# Patient Record
Sex: Female | Born: 1945 | Race: White | Hispanic: No | Smoking: Former smoker
Health system: Southern US, Community
[De-identification: ages and names within clinical notes are randomized; demographics above are authoritative.]

## PROBLEM LIST (undated history)

## (undated) DIAGNOSIS — R87619 Unspecified abnormal cytological findings in specimens from cervix uteri: Secondary | ICD-10-CM

## (undated) DIAGNOSIS — I341 Nonrheumatic mitral (valve) prolapse: Secondary | ICD-10-CM

## (undated) DIAGNOSIS — L4050 Arthropathic psoriasis, unspecified: Secondary | ICD-10-CM

## (undated) DIAGNOSIS — H209 Unspecified iridocyclitis: Secondary | ICD-10-CM

## (undated) DIAGNOSIS — E7800 Pure hypercholesterolemia, unspecified: Secondary | ICD-10-CM

## (undated) DIAGNOSIS — Z862 Personal history of diseases of the blood and blood-forming organs and certain disorders involving the immune mechanism: Secondary | ICD-10-CM

## (undated) DIAGNOSIS — N812 Incomplete uterovaginal prolapse: Secondary | ICD-10-CM

## (undated) DIAGNOSIS — K529 Noninfective gastroenteritis and colitis, unspecified: Secondary | ICD-10-CM

## (undated) DIAGNOSIS — M1990 Unspecified osteoarthritis, unspecified site: Secondary | ICD-10-CM

## (undated) DIAGNOSIS — R32 Unspecified urinary incontinence: Secondary | ICD-10-CM

## (undated) DIAGNOSIS — F419 Anxiety disorder, unspecified: Secondary | ICD-10-CM

## (undated) DIAGNOSIS — H43813 Vitreous degeneration, bilateral: Secondary | ICD-10-CM

## (undated) HISTORY — DX: Pure hypercholesterolemia, unspecified: E78.00

## (undated) HISTORY — DX: Unspecified iridocyclitis: H20.9

## (undated) HISTORY — DX: Incomplete uterovaginal prolapse: N81.2

## (undated) HISTORY — DX: Personal history of diseases of the blood and blood-forming organs and certain disorders involving the immune mechanism: Z86.2

## (undated) HISTORY — DX: Nonrheumatic mitral (valve) prolapse: I34.1

## (undated) HISTORY — DX: Unspecified urinary incontinence: R32

## (undated) HISTORY — DX: Vitreous degeneration, bilateral: H43.813

## (undated) HISTORY — DX: Unspecified abnormal cytological findings in specimens from cervix uteri: R87.619

## (undated) HISTORY — PX: CRYOTHERAPY: SHX1416

## (undated) HISTORY — DX: Noninfective gastroenteritis and colitis, unspecified: K52.9

## (undated) HISTORY — DX: Anxiety disorder, unspecified: F41.9

## (undated) HISTORY — DX: Arthropathic psoriasis, unspecified: L40.50

---
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
## 1977-05-11 HISTORY — PX: BREAST ENHANCEMENT SURGERY: SHX7

## 1998-08-13 ENCOUNTER — Other Ambulatory Visit: Admission: RE | Admit: 1998-08-13 | Discharge: 1998-08-13 | Payer: Self-pay | Admitting: Obstetrics and Gynecology

## 1999-03-06 ENCOUNTER — Other Ambulatory Visit: Admission: RE | Admit: 1999-03-06 | Discharge: 1999-03-06 | Payer: Self-pay | Admitting: Gynecology

## 1999-11-13 ENCOUNTER — Other Ambulatory Visit: Admission: RE | Admit: 1999-11-13 | Discharge: 1999-11-13 | Payer: Self-pay | Admitting: Gynecology

## 2000-11-16 ENCOUNTER — Other Ambulatory Visit: Admission: RE | Admit: 2000-11-16 | Discharge: 2000-11-16 | Payer: Self-pay | Admitting: Gynecology

## 2003-02-09 ENCOUNTER — Other Ambulatory Visit: Admission: RE | Admit: 2003-02-09 | Discharge: 2003-02-09 | Payer: Self-pay | Admitting: Gynecology

## 2004-03-06 ENCOUNTER — Other Ambulatory Visit: Admission: RE | Admit: 2004-03-06 | Discharge: 2004-03-06 | Payer: Self-pay | Admitting: Obstetrics and Gynecology

## 2005-01-27 ENCOUNTER — Ambulatory Visit: Payer: Self-pay | Admitting: Internal Medicine

## 2005-02-10 ENCOUNTER — Ambulatory Visit: Payer: Self-pay | Admitting: Internal Medicine

## 2005-02-10 ENCOUNTER — Encounter (INDEPENDENT_AMBULATORY_CARE_PROVIDER_SITE_OTHER): Payer: Self-pay | Admitting: Specialist

## 2005-03-09 ENCOUNTER — Other Ambulatory Visit: Admission: RE | Admit: 2005-03-09 | Discharge: 2005-03-09 | Payer: Self-pay | Admitting: Obstetrics and Gynecology

## 2006-02-19 ENCOUNTER — Other Ambulatory Visit: Admission: RE | Admit: 2006-02-19 | Discharge: 2006-02-19 | Payer: Self-pay | Admitting: Obstetrics and Gynecology

## 2007-06-08 ENCOUNTER — Other Ambulatory Visit: Admission: RE | Admit: 2007-06-08 | Discharge: 2007-06-08 | Payer: Self-pay | Admitting: Obstetrics and Gynecology

## 2008-03-23 ENCOUNTER — Ambulatory Visit: Payer: Self-pay | Admitting: *Deleted

## 2008-05-11 HISTORY — PX: BREAST IMPLANT REMOVAL: SUR1101

## 2008-09-05 ENCOUNTER — Other Ambulatory Visit: Admission: RE | Admit: 2008-09-05 | Discharge: 2008-09-05 | Payer: Self-pay | Admitting: Obstetrics and Gynecology

## 2011-12-30 ENCOUNTER — Encounter: Payer: Self-pay | Admitting: Internal Medicine

## 2012-09-02 ENCOUNTER — Encounter: Payer: Self-pay | Admitting: Internal Medicine

## 2013-04-05 ENCOUNTER — Other Ambulatory Visit: Payer: Self-pay | Admitting: Dermatology

## 2013-05-30 ENCOUNTER — Encounter: Payer: Self-pay | Admitting: Obstetrics and Gynecology

## 2013-05-31 ENCOUNTER — Encounter: Payer: Self-pay | Admitting: Obstetrics and Gynecology

## 2013-05-31 ENCOUNTER — Ambulatory Visit: Payer: Self-pay | Admitting: Obstetrics and Gynecology

## 2013-05-31 ENCOUNTER — Ambulatory Visit (INDEPENDENT_AMBULATORY_CARE_PROVIDER_SITE_OTHER): Payer: Medicare Other | Admitting: Obstetrics and Gynecology

## 2013-05-31 ENCOUNTER — Telehealth: Payer: Self-pay | Admitting: Obstetrics and Gynecology

## 2013-05-31 VITALS — BP 150/80 | HR 61 | Resp 14 | Ht 64.5 in | Wt 156.0 lb

## 2013-05-31 DIAGNOSIS — Z01419 Encounter for gynecological examination (general) (routine) without abnormal findings: Secondary | ICD-10-CM

## 2013-05-31 NOTE — Patient Instructions (Signed)

## 2013-05-31 NOTE — Progress Notes (Signed)
68 y.o. [id number] Divorced Caucasian Fe here for annual exam.   Has mild hyperlipidemia and mildly elevated blood pressure.  Patient developed psoriasis recently.  Wants help. Interested in complimentary medicine. Also will be seeing [facility].   Interested in a pessary for prolapse.  Having urinary incontinence. Leaks with cough, laugh, or sneeze.   Wants a pap today.   No LMP recorded. Patient is postmenopausal.          Sexually active: no  The current method of family planning is post menopausal status.    Exercising: yes  treadmill Smoker:  no  Health Maintenance: Pap:  2011 neg Pap history:  History of abnormal paps in remote past.  Had a dilation and curettage and conization in late 20s or early 58s.  Last one was here in the office. Had colposcopy with Dr. Joan Flores.  No treatment.   MMG:  2014 Colonoscopy:  2012 polyps  BMD:   2014 TDaP:  2014 Labs: none Self breast exam: not done   reports that she quit smoking about 2 years ago. She has never used smokeless tobacco. She reports that she drinks about 1.0 ounces of alcohol per week. She reports that she does not use illicit drugs.  Past Medical History  Diagnosis Date  . MVP (mitral valve prolapse)   . Anxiety   . Colitis   . Vitreous detachment, bilateral   . Hypercholesteremia   . History of anemia   . Abnormal Pap smear of cervix     Past Surgical History  Procedure Laterality Date  . Tonsillectomy and adenoidectomy  1954  . Breast enhancement surgery  1979  . Breast implant removal  2010  . Cryotherapy      Current Outpatient Prescriptions  Medication Sig Dispense Refill  . aspirin 81 MG tablet Take 81 mg by mouth as needed for pain.      Marland Kitchen atorvastatin (LIPITOR) 40 MG tablet Take 40 mg by mouth daily.      . Cholecalciferol (VITAMIN D PO) Take by mouth daily.      . sertraline (ZOLOFT) 20 MG/ML concentrated solution Take by mouth daily.      Marland Kitchen UNABLE TO FIND Med Name: Pro Ton Pump      .  VITAMIN K PO Take by mouth daily.      . Multiple Vitamins-Minerals (MULTIVITAMIN PO) Take by mouth.      . Omega-3 Fatty Acids (OMEGA-3 FISH OIL PO) Take by mouth.       No current facility-administered medications for this visit.    Family History  Problem Relation Age of Onset  . Breast cancer Mother   . Osteoporosis Mother   . Hypertension Father   . Aneurysm Father   . Pancreatic cancer Maternal Grandmother   . Diabetes Paternal Grandfather     Type 2    ROS:  Pertinent items are noted in HPI.  Otherwise, a comprehensive ROS was negative.  Exam:   BP 150/80  Pulse 61  Resp 14  Ht 5' 4.5" (1.638 m)  Wt 156 lb (70.761 kg)  BMI 26.37 kg/m2 Height: 5' 4.5" (163.8 cm)  Ht Readings from Last 3 Encounters:  05/31/13 5' 4.5" (1.638 m)    General appearance: alert, cooperative and appears stated age Head: Normocephalic, without obvious abnormality, atraumatic Neck: no adenopathy, supple, symmetrical, trachea midline and thyroid normal to inspection and palpation Lungs: clear to auscultation bilaterally Breasts: normal appearance, no masses or tenderness, No nipple retraction or dimpling, No  nipple discharge or bleeding, No axillary or supraclavicular adenopathy,  bilateral inferior breast scars consistent with prior surgery. Heart: regular rate and rhythm Abdomen: soft, non-tender; no masses,  no organomegaly Extremities: extremities normal, atraumatic, no cyanosis or edema Skin: Skin color, texture, turgor normal. No rashes or lesions Lymph nodes: Cervical, supraclavicular, and axillary nodes normal. No abnormal inguinal nodes palpated Neurologic: Grossly normal   Pelvic: External genitalia:  Scattered erythematous plaques, nontender.               Urethra:  normal appearing urethra with no masses, tenderness or lesions              Bartholin's and Skene's: normal                 Vagina: normal appearing vagina with normal color and discharge, no lesions.  First degree  cystocele without strain.               Cervix: no lesions              Pap taken: yes and HR HPV testing.  Bimanual Exam:  Uterus:  normal size, contour, position, consistency, mobility, non-tender              Adnexa: normal adnexa               Rectovaginal: Confirms               Anus:  normal sphincter tone, no lesions  A:  Well Woman with normal exam Pelvic organ prolapse. Vulvar psoriasis.   P:   Pap smear as per guidelines   M[facility] at [facility].  Patient will call to schedule.  Return for pessary fitting. I discussed [email or web address] as a source of medical information.  I suggested Dr. Adriana Simas as a possible physician to explore complimentary medicine for her psoriasis.   return annually or prn  An After Visit Summary was printed and given to the patient.

## 2013-05-31 NOTE — Telephone Encounter (Signed)
Pt says she needs to schedule an appt with Dr Quincy Simmonds for a fitting?

## 2013-06-01 NOTE — Telephone Encounter (Signed)
Spoke with pt to schedule pessary fitting as discussed at yesterday's visit with BS. Made appt 06-14-13 at 3 pm.

## 2013-06-05 LAB — IPS PAP TEST WITH HPV

## 2013-06-11 DIAGNOSIS — N812 Incomplete uterovaginal prolapse: Secondary | ICD-10-CM

## 2013-06-11 HISTORY — DX: Incomplete uterovaginal prolapse: N81.2

## 2013-06-14 ENCOUNTER — Encounter: Payer: Self-pay | Admitting: Obstetrics and Gynecology

## 2013-06-14 ENCOUNTER — Ambulatory Visit (INDEPENDENT_AMBULATORY_CARE_PROVIDER_SITE_OTHER): Payer: Medicare Other | Admitting: Obstetrics and Gynecology

## 2013-06-14 VITALS — BP 141/78 | HR 63 | Resp 14 | Ht 64.75 in | Wt 150.0 lb

## 2013-06-14 DIAGNOSIS — N812 Incomplete uterovaginal prolapse: Secondary | ICD-10-CM

## 2013-06-14 NOTE — Patient Instructions (Signed)
We will contact you when the pessary arrives.

## 2013-06-15 ENCOUNTER — Encounter: Payer: Self-pay | Admitting: Obstetrics and Gynecology

## 2013-06-15 NOTE — Progress Notes (Signed)
Patient ID: Janice Welch, female   DOB: 1946/04/12, 68 y.o.   MRN: [id number]  Subjective  Patient presents today for a pessary fitting. Notes protrusion outside of the vagina when she is exercising. Also having urinary incontinence when she coughs, sneezes, or laughs.  Seeing a dermatologist for her psoriasis of the vulva.  Objective  Pelvic exam  Patchy erythma of the vulva. Normal urethra. Cervix no lesions. Third degree cystocele. First degree uterine prolapse. No significant rectocele. Uterus small and nontender. No adnexal masses or tenderness.  Pessaries tried - #2 incontinence dish, #3 incontinence dish, and #4 incontinence dish.  Patient feels most comfortable with #3 incontinence dish.    Assessment  Incomplete uterovaginal prolapse. Genuine stress incontinence.  Plan  Will order an incontinence dish #3. Patient will be called when it arrives. Instructions in use reviewed with patient.  May consider vaginal estrogen cream use with the pessary.

## 2013-06-19 ENCOUNTER — Encounter: Payer: Self-pay | Admitting: Obstetrics and Gynecology

## 2013-06-22 ENCOUNTER — Telehealth: Payer: Self-pay | Admitting: *Deleted

## 2013-06-22 NOTE — Telephone Encounter (Signed)
Called pt to notify pessary is ready. She made appt for 07/12/2013 @10a .

## 2013-07-12 ENCOUNTER — Ambulatory Visit (INDEPENDENT_AMBULATORY_CARE_PROVIDER_SITE_OTHER): Payer: Medicare Other | Admitting: Obstetrics and Gynecology

## 2013-07-12 ENCOUNTER — Encounter: Payer: Self-pay | Admitting: Obstetrics and Gynecology

## 2013-07-12 ENCOUNTER — Telehealth: Payer: Self-pay | Admitting: Obstetrics and Gynecology

## 2013-07-12 VITALS — BP 102/69 | HR 66 | Resp 14 | Wt 148.0 lb

## 2013-07-12 DIAGNOSIS — N393 Stress incontinence (female) (male): Secondary | ICD-10-CM

## 2013-07-12 DIAGNOSIS — N812 Incomplete uterovaginal prolapse: Secondary | ICD-10-CM

## 2013-07-12 MED ORDER — ESTRADIOL 0.1 MG/GM VA CREA: TOPICAL_CREAM | VAGINAL | Status: DC

## 2013-07-12 NOTE — Telephone Encounter (Signed)
Patient needs a 10 day recheck with Dr.Silva. No appointments available.

## 2013-07-12 NOTE — Patient Instructions (Signed)
Estradiol vaginal cream What is this medicine? ESTRADIOL (es tra DYE ole) contains the female hormone estrogen. It is used for symptoms of menopause, like vaginal dryness and irritation. This medicine may be used for other purposes; ask your health care provider or pharmacist if you have questions. COMMON BRAND NAME(S): Estrace What should I tell my health care provider before I take this medicine? They need to know if you have any of these conditions: -abnormal vaginal bleeding -blood vessel disease or blood clots -breast, cervical, endometrial, ovarian, liver, or uterine cancer -dementia -diabetes -gallbladder disease -heart disease or recent heart attack -high blood pressure -high cholesterol -high levels of calcium in the blood -hysterectomy -kidney disease -liver disease -migraine headaches -protein C deficiency -protein S deficiency -stroke -systemic lupus erythematosus (SLE) -tobacco smoker -an unusual or allergic reaction to estrogens, other hormones, soy, other medicines, foods, dyes, or preservatives -pregnant or trying to get pregnant -breast-feeding How should I use this medicine? This medicine is for use in the vagina only. Do not take by mouth. Follow the directions on the prescription label. Read package directions carefully before using. Use the special applicator supplied with the cream. Wash hands before and after use. Fill the applicator with the prescribed amount of cream. Lie on your back, part and bend your knees. Insert the applicator into the vagina and push the plunger to expel the cream into the vagina. Wash the applicator with warm soapy water and rinse well. Use exactly as directed for the complete length of time prescribed. Do not stop using except on the advice of your doctor or health care professional. A patient package insert for the product will be given with each prescription and refill. Read this sheet carefully each time. The sheet may change  frequently. Talk to your pediatrician regarding the use of this medicine in children. This medicine is not approved for use in children. Overdosage: If you think you have taken too much of this medicine contact a poison control center or emergency room at once. NOTE: This medicine is only for you. Do not share this medicine with others. What if I miss a dose? If you miss a dose, use it as soon as you can. If it is almost time for your next dose, use only that dose. Do not use double or extra doses. What may interact with this medicine? Do not take this medicine with any of the following medications: -aromatase inhibitors like aminoglutethimide, anastrozole, exemestane, letrozole, testolactone This medicine may also interact with the following medications: -barbiturates used for inducing sleep or treating seizures -carbamazepine -grapefruit juice -medicines for fungal infections like ketoconazole and itraconazole -raloxifene -rifabutin -rifampin -rifapentine -ritonavir -some antibiotics used to treat infections -St. John's Wort -tamoxifen -warfarin This list may not describe all possible interactions. Give your health care provider a list of all the medicines, herbs, non-prescription drugs, or dietary supplements you use. Also tell them if you smoke, drink alcohol, or use illegal drugs. Some items may interact with your medicine. What should I watch for while using this medicine? Visit your health care professional for regular checks on your progress. You will need a regular breast and pelvic exam. You should also discuss the need for regular mammograms with your health care professional, and follow his or her guidelines. This medicine can make your body retain fluid, making your fingers, hands, or ankles swell. Your blood pressure can go up. Contact your doctor or health care professional if you feel you are retaining fluid. If you have  any reason to think you are pregnant, stop taking  this medicine at once and contact your doctor or health care professional. Tobacco smoking increases the risk of getting a blood clot or having a stroke, especially if you are more than 67 years old. You are strongly advised not to smoke. If you wear contact lenses and notice visual changes, or if the lenses begin to feel uncomfortable, consult your eye care specialist. If you are going to have elective surgery, you may need to stop taking this medicine beforehand. Consult your health care professional for advice prior to scheduling the surgery. What side effects may I notice from receiving this medicine? Side effects that you should report to your doctor or health care professional as soon as possible: -allergic reactions like skin rash, itching or hives, swelling of the face, lips, or tongue -breast tissue changes or discharge -changes in vision -chest pain -confusion, trouble speaking or understanding -dark urine -general ill feeling or flu-like symptoms -light-colored stools -nausea, vomiting -pain, swelling, warmth in the leg -right upper belly pain -severe headaches -shortness of breath -sudden numbness or weakness of the face, arm or leg -trouble walking, dizziness, loss of balance or coordination -unusual vaginal bleeding -yellowing of the eyes or skin Side effects that usually do not require medical attention (report to your doctor or health care professional if they continue or are bothersome): -hair loss -increased hunger or thirst -increased urination -symptoms of vaginal infection like itching, irritation or unusual discharge -unusually weak or tired This list may not describe all possible side effects. Call your doctor for medical advice about side effects. You may report side effects to FDA at 1-800-FDA-1088. Where should I keep my medicine? Keep out of the reach of children. Store at room temperature between 15 and 30 degrees C (59 and 86 degrees F). Protect from  temperatures above 40 degrees C (104 degrees C). Do not freeze. Throw away any unused medicine after the expiration date. NOTE: This sheet is a summary. It may not cover all possible information. If you have questions about this medicine, talk to your doctor, pharmacist, or health care provider.  2014, Elsevier/Gold Standard. (2010-07-30 09:18:12)  

## 2013-07-12 NOTE — Progress Notes (Addendum)
Patient ID: Janice Welch, female   DOB: [birth year], 68 y.o.   MRN: [id number]  Subjective  Patient presents today for a pessary supply. Notes protrusion outside of the vagina when she is exercising.  Also having urinary incontinence when she coughs, sneezes, or laughs.  Seeing a dermatologist for her psoriasis of the vulva  Incontinence disk #3 fitted at the last visit.   Mammogram 06/02/13 showed benign calcifications of the left breast.   Objective  Small cystocele.    Assessment  Incomplete uterovaginal prolapse. Genuine stress incontinence. Fitted with an incontinence dish #3 and given to patient - Integra Pessary dish size e with support   Plan  Patient able to void with pessary in.  Can place and remove without difficulty.  Patient instructed in used.  Estrace 1/2 gram per vagina at hs twice a week.  Benefits and risks discussed of thromboembolic events (DVT, PE, MI, stroke) and breast cancer.  Follow up in 10 days for a recheck.   15 minutes face to face time of which over 50% was spent in counseling.   After visit summary to patient.

## 2013-07-13 NOTE — Telephone Encounter (Signed)
Dr Quincy Simmonds, [facility].

## 2013-07-13 NOTE — Telephone Encounter (Signed)
Patient now scheduled for 07-21-13.  Routing to provider for final review. Patient agreeable to disposition. Will close encounter

## 2013-07-19 ENCOUNTER — Encounter: Payer: Self-pay | Admitting: Obstetrics and Gynecology

## 2013-07-21 ENCOUNTER — Encounter: Payer: Self-pay | Admitting: Obstetrics and Gynecology

## 2013-07-21 ENCOUNTER — Ambulatory Visit (INDEPENDENT_AMBULATORY_CARE_PROVIDER_SITE_OTHER): Payer: Medicare Other | Admitting: Obstetrics and Gynecology

## 2013-07-21 VITALS — BP 110/70 | HR 60 | Ht 64.75 in | Wt 147.5 lb

## 2013-07-21 DIAGNOSIS — N812 Incomplete uterovaginal prolapse: Secondary | ICD-10-CM

## 2013-07-21 NOTE — Progress Notes (Signed)
Patient ID: Janice Welch, female   DOB: 05-May-1946, 68 y.o.   MRN: [id number]   Subjective   Patient is here for a pessary check.  Received an incontinence dish #4 on 07/12/13 along with a prescription for Estrace vaginal cream.  Comfortable for use.  Able to place and remove.   Voiding well.  Bowel movements OK.  Not using the vaginal cream.  Was too expensive so did not fill the Rx.  Just using KY jelly.   Cleansing with dish soap.  Objective  Pessary removed, cleansed, and replaced.  Yellow coloration to the pessary.  Pelvic exam - normal external genitalia and urethra. Cervix and vagina without lesions or erythema. No discharge noted.   Assessment  Incomplete uterovaginal prolapse. Doing well with incontinence dish.  Plan  Continue pessary use. Patient will research cost of Estring, Premarin vaginal cream and Vagifem prices to see if one of these will be more affordable. Use regular antibacterial bath soap instead of dish soap to clean. Return prn. Has appointment for annual exam in January 2016.   15 minutes face to face tie of which over 50% was spent in counseling.

## 2013-07-21 NOTE — Patient Instructions (Signed)
Consider Estring, Vagifem tablets, Estrace vaginal cream, and premarin cream for vaginal estrogen options. If you find one that is attractive, please let me know and I can prescribe it for you.

## 2013-09-08 ENCOUNTER — Encounter: Payer: Self-pay | Admitting: Obstetrics and Gynecology

## 2014-03-01 ENCOUNTER — Telehealth: Payer: Self-pay | Admitting: Obstetrics and Gynecology

## 2014-03-01 NOTE — Telephone Encounter (Signed)
Dr cx/rs appt/ lmtcb to rs °

## 2014-03-12 ENCOUNTER — Encounter: Payer: Self-pay | Admitting: Obstetrics and Gynecology

## 2014-06-01 ENCOUNTER — Ambulatory Visit: Payer: Medicare Other | Admitting: Obstetrics and Gynecology

## 2014-06-07 ENCOUNTER — Encounter: Payer: Self-pay | Admitting: Certified Nurse Midwife

## 2014-06-07 ENCOUNTER — Ambulatory Visit (INDEPENDENT_AMBULATORY_CARE_PROVIDER_SITE_OTHER): Payer: Medicare Other | Admitting: Certified Nurse Midwife

## 2014-06-07 ENCOUNTER — Ambulatory Visit: Payer: Medicare Other | Admitting: Obstetrics and Gynecology

## 2014-06-07 VITALS — BP 100/68 | HR 68 | Resp 16 | Ht 64.5 in | Wt 138.0 lb

## 2014-06-07 DIAGNOSIS — Z4689 Encounter for fitting and adjustment of other specified devices: Secondary | ICD-10-CM

## 2014-06-07 DIAGNOSIS — Z01419 Encounter for gynecological examination (general) (routine) without abnormal findings: Secondary | ICD-10-CM

## 2014-06-07 NOTE — Progress Notes (Signed)
69 y.o. [id number] Divorced  Caucasian Fe here for annual exam.  Menopausal dryness using [facility] cream with good results. Reports no vaginal bleeding and no problems with Pessary use for support. Denies urinary symptoms or bowel symptoms with pessary use. Eats mainly vegetarian diet with some fish and chicken.  Sees PCP for cholesterol management/ aex/labs. No health issues today.  Patient's last menstrual period was 05/11/1992.          Sexually active: No.  The current method of family planning is post menopausal status.    Exercising: Yes.    walking Smoker:  no  Health Maintenance: Pap:  2011 neg MMG:  06-02-13 neg Colonoscopy:  2012 polyp q 43yrs BMD:   2013 TDaP:  2014 Labs: none Self breast exam: not done   reports that she quit smoking about 3 years ago. She has never used smokeless tobacco. She reports that she drinks alcohol. She reports that she does not use illicit drugs.  Past Medical History  Diagnosis Date  . MVP (mitral valve prolapse)   . Anxiety   . Colitis   . Vitreous detachment, bilateral   . Hypercholesteremia   . History of anemia   . Abnormal Pap smear of cervix   . Urinary incontinence   . First degree uterine prolapse 06/2013    Past Surgical History  Procedure Laterality Date  . Tonsillectomy and adenoidectomy  1954  . Breast enhancement surgery  1979  . Breast implant removal  2010  . Cryotherapy      Current Outpatient Prescriptions  Medication Sig Dispense Refill  . atorvastatin (LIPITOR) 40 MG tablet Take 20 mg by mouth daily.     . Biotin 7500 MCG TABS Take by mouth daily.    . Cholecalciferol (VITAMIN D PO) Take 5,000 Int'l Units by mouth daily.     . Cyanocobalamin (VITAMIN B 12 PO) Take 2,000 mg by mouth.    . FOLIC ACID PO Take by mouth daily.    . Melatonin 5 MG CAPS Take 5 mg by mouth daily.    . milk thistle 175 MG tablet Take 175 mg by mouth daily.    . Multiple Vitamins-Minerals (MULTIVITAMIN PO) Take by mouth.    .  Multiple Vitamins-Minerals (ZINC PO) Take by mouth daily.    . NON FORMULARY Tumeric  1000mg  daily    . Omega-3 Fatty Acids (OMEGA-3 FISH OIL PO) Take by mouth.    . SELENIUM PO Take by mouth daily.    . sertraline (ZOLOFT) 50 MG tablet Take 50 mg by mouth daily.    Marland Kitchen UNABLE TO FIND 500 mg daily. Choline & inositol     No current facility-administered medications for this visit.    Family History  Problem Relation Age of Onset  . Breast cancer Mother   . Osteoporosis Mother   . Hypertension Father   . Aneurysm Father   . Pancreatic cancer Maternal Grandmother   . Diabetes Paternal Grandfather     Type 2    ROS:  Pertinent items are noted in HPI.  Otherwise, a comprehensive ROS was negative.  Exam:   BP 100/68 mmHg  Pulse 68  Resp 16  Ht 5' 4.5" (1.638 m)  Wt 138 lb (62.596 kg)  BMI 23.33 kg/m2  LMP 05/11/1992 Height: 5' 4.5" (163.8 cm) Ht Readings from Last 3 Encounters:  06/07/14 5' 4.5" (1.638 m)  07/21/13 5' 4.75" (1.645 m)  06/14/13 5' 4.75" (1.645 m)    General appearance:  alert, cooperative and appears stated age Head: Normocephalic, without obvious abnormality, atraumatic Neck: no adenopathy, supple, symmetrical, trachea midline and thyroid normal to inspection and palpation Lungs: clear to auscultation bilaterally Breasts: normal appearance, no masses or tenderness, No nipple retraction or dimpling, No nipple discharge or bleeding, No axillary or supraclavicular adenopathy Heart: regular rate and rhythm Abdomen: soft, non-tender; no masses,  no organomegaly Extremities: extremities normal, atraumatic, no cyanosis or edema Skin: Skin color, texture, turgor normal. No rashes or lesions Lymph nodes: Cervical, supraclavicular, and axillary nodes normal. No abnormal inguinal nodes palpated Neurologic: Grossly normal   Pelvic: External genitalia:  no lesions              Urethra:  normal appearing urethra with no masses, tenderness or lesions               Bartholin's and Skene's: normal                 Vagina: normal appearing vagina with normal color and discharge, no lesions or excoriations or ulcerations after removing pessary noted in proper position for support              Cervix: normal with cryo appearance, non tender no ulcerations  noted              Pap taken: No. Bimanual Exam:  Uterus:  normal size, contour, position, consistency, mobility, non-tender              Adnexa: normal adnexa and no mass, fullness, tenderness               Rectovaginal: Confirms               Anus:  normal sphincter tone, no lesions Pessary cleaned and reinserted with proper position noted and patient agreed feels normal. Ambulated and sat with change.Patient removes and inserts herself with Rolm Baptise use.    A:  Well Woman with normal exam  Menopausal no HRT  Incomplete uterovaginal prolapse with  # 3 incontinence dish with support, no contraindications to continued use  Cholesterol management with stable medication with PCP  History of abnormal pap with Cryo greater than 20 years ago.  P:   Reviewed health and wellness pertinent to exam  Aware of need for evaluation if vaginal bleeding  Aware of warning signs with pessary use  Continue follow up as indicated with PCP  Pap smear not taken today   counseled on breast self exam, mammography screening, adequate intake of calcium and vitamin D, diet and exercise, Kegel's exercises  return annually or prn  An After Visit Summary was printed and given to the patient.

## 2014-06-07 NOTE — Patient Instructions (Signed)

## 2014-06-07 NOTE — Progress Notes (Signed)
Reviewed personally.  M. Suzanne Rickey Sadowski, MD.  

## 2014-07-11 ENCOUNTER — Encounter: Payer: Self-pay | Admitting: Certified Nurse Midwife

## 2014-07-11 NOTE — Telephone Encounter (Signed)
Spoke with Leeroy Bock, front office supervisor, and okay to send records to home address as patient requesting in writing via mychart that they be sent to home address.   Mammogram, name and address confirmed with office administrator, Thayer Ohm. Mailed via [place] mail.   Routing to provider for final review. Patient agreeable to disposition. Will close encounter

## 2014-11-05 ENCOUNTER — Other Ambulatory Visit: Payer: Self-pay

## 2015-06-13 ENCOUNTER — Encounter: Payer: Self-pay | Admitting: Certified Nurse Midwife

## 2015-06-13 ENCOUNTER — Ambulatory Visit (INDEPENDENT_AMBULATORY_CARE_PROVIDER_SITE_OTHER): Payer: Medicare Other | Admitting: Certified Nurse Midwife

## 2015-06-13 VITALS — BP 110/68 | HR 72 | Resp 16 | Ht 64.25 in | Wt 134.0 lb

## 2015-06-13 DIAGNOSIS — Z124 Encounter for screening for malignant neoplasm of cervix: Secondary | ICD-10-CM | POA: Diagnosis not present

## 2015-06-13 DIAGNOSIS — Z01419 Encounter for gynecological examination (general) (routine) without abnormal findings: Secondary | ICD-10-CM

## 2015-06-13 NOTE — Patient Instructions (Signed)

## 2015-06-13 NOTE — Progress Notes (Signed)
70 y.o. [id number] Divorced  Caucasian Fe here for annual exam.  Menopausal no HRT. Denies vaginal bleeding or vaginal dryness. Pessary is still working well for vaginal support.( did not bring with her today). Occasional stress incontinence no issues. Saw PCP recently for aex/labs/Cholesterol management. Patient takes a supplement smoothie daily and cholesterol is so much better with decrease in medication now. No other health issues today.  Patient's last menstrual period was 05/11/1992.          Sexually active: No.  The current method of family planning is post menopausal status.    Exercising: Yes.    walking Smoker:  no  Health Maintenance: Pap: 05-31-13 neg HPV HR neg, cryo done in the past MMG:  06-21-14 category b density birads 1:neg Colonoscopy:  2012 polyp f/u 45yrs due this year BMD:   2013 TDaP:  2014 Shingles: had done  Pneumonia: 3yrs ago Hep C and HIV: neg per patient Labs: pcp Self breast exam: done occ   reports that she quit smoking about 4 years ago. She has never used smokeless tobacco. She reports that she does not drink alcohol or use illicit drugs.  Past Medical History  Diagnosis Date  . MVP (mitral valve prolapse)   . Anxiety   . Colitis   . Vitreous detachment, bilateral   . Hypercholesteremia   . History of anemia   . Abnormal Pap smear of cervix   . Urinary incontinence   . First degree uterine prolapse 06/2013    Past Surgical History  Procedure Laterality Date  . Tonsillectomy and adenoidectomy  1954  . Breast enhancement surgery  1979  . Breast implant removal  2010  . Cryotherapy      Current Outpatient Prescriptions  Medication Sig Dispense Refill  . atorvastatin (LIPITOR) 10 MG tablet Take 10 mg by mouth daily.    . Biotin 7500 MCG TABS Take by mouth daily.    . Cholecalciferol (VITAMIN D PO) Take 5,000 Int'l Units by mouth daily.     . Cyanocobalamin (VITAMIN B 12 PO) Take 2,000 mg by mouth.    . Flaxseed, Linseed, (FLAXSEED OIL PO) Take  by mouth daily.    Marland Kitchen FOLIC ACID PO Take by mouth daily.    . Melatonin 5 MG CAPS Take 5 mg by mouth daily.    . Multiple Vitamins-Minerals (MULTIVITAMIN PO) Take by mouth.    . Multiple Vitamins-Minerals (ZINC PO) Take by mouth daily.    . NON FORMULARY Tumeric  1000mg  daily    . Omega-3 Fatty Acids (OMEGA-3 FISH OIL PO) Take by mouth.    . SELENIUM PO Take by mouth daily.    . sertraline (ZOLOFT) 50 MG tablet Take 50 mg by mouth daily.    Marland Kitchen UNABLE TO FIND daily. Wheat grass powder     No current facility-administered medications for this visit.    Family History  Problem Relation Age of Onset  . Breast cancer Mother   . Osteoporosis Mother   . Hypertension Father   . Aneurysm Father   . Pancreatic cancer Maternal Grandmother   . Diabetes Paternal Grandfather     Type 2    ROS:  Pertinent items are noted in HPI.  Otherwise, a comprehensive ROS was negative.  Exam:   BP 110/68 mmHg  Pulse 72  Resp 16  Ht 5' 4.25" (1.632 m)  Wt 134 lb (60.782 kg)  BMI 22.82 kg/m2  LMP 05/11/1992 Height: 5' 4.25" (163.2 cm) Ht Readings from Last  3 Encounters:  06/13/15 5' 4.25" (1.632 m)  06/07/14 5' 4.5" (1.638 m)  07/21/13 5' 4.75" (1.645 m)    General appearance: alert, cooperative and appears stated age Head: Normocephalic, without obvious abnormality, atraumatic Neck: no adenopathy, supple, symmetrical, trachea midline and thyroid normal to inspection and palpation Lungs: clear to auscultation bilaterally Breasts: normal appearance, no masses or tenderness, No nipple retraction or dimpling, No nipple discharge or bleeding, No axillary or supraclavicular adenopathy Heart: regular rate and rhythm Abdomen: soft, non-tender; no masses,  no organomegaly Extremities: extremities normal, atraumatic, no cyanosis or edema Skin: Skin color, texture, turgor normal. No rashes or lesions Lymph nodes: Cervical, supraclavicular, and axillary nodes normal. No abnormal inguinal nodes  palpated Neurologic: Grossly normal   Pelvic: External genitalia:  no lesions              Urethra:  normal appearing urethra with no masses, tenderness or lesions              Bartholin's and Skene's: normal                 Vagina: normal appearing vagina with normal color and discharge, no lesions,  No ulcerations noted              Cervix: no cervical motion tenderness, no lesions and scant blood with pap only, no ulcerations noted              Pap taken: Yes.   Bimanual Exam:  Uterus:  normal size, contour, position, consistency, mobility, non-tender and anteverted              Adnexa: normal adnexa and no mass, fullness, tenderness               Rectovaginal: Confirms               Anus:  normal sphincter tone, no lesions  Chaperone present: yes  A:  Well Woman with normal exam  Menopausal no HRT  Complete uterine prolapse with Pessary use, working well  Cholesterol management with PCP  Colonoscopy due this year, patient plans to schedule this summer,IFOB given by PCP    P:   Reviewed health and wellness pertinent to exam  Aware of need to advise if vaginal bleeding or odor with pessary use. Discussed importance of moisturizer use with pessary to avoid ulcerations. Patient will start with coconut oil 2-3 times weekly.  Continue follow up with PCP as needed.  Pap smear as above taken with HPV reflex   counseled on breast self exam, mammography screening, adequate intake of calcium and vitamin D, diet and exercise  return annually or prn  An After Visit Summary was printed and given to the patient.

## 2015-06-13 NOTE — Progress Notes (Signed)
Reviewed personally.  M. Suzanne Jaryan Chicoine, MD.  

## 2015-06-14 LAB — IPS PAP TEST WITH REFLEX TO HPV

## 2015-07-23 ENCOUNTER — Encounter: Payer: Self-pay | Admitting: Certified Nurse Midwife

## 2015-07-23 ENCOUNTER — Ambulatory Visit (INDEPENDENT_AMBULATORY_CARE_PROVIDER_SITE_OTHER): Payer: Medicare Other | Admitting: Certified Nurse Midwife

## 2015-07-23 VITALS — BP 110/70 | HR 70 | Resp 16 | Ht 64.25 in | Wt 139.0 lb

## 2015-07-23 DIAGNOSIS — N63 Unspecified lump in breast: Secondary | ICD-10-CM

## 2015-07-23 DIAGNOSIS — N6310 Unspecified lump in the right breast, unspecified quadrant: Secondary | ICD-10-CM

## 2015-07-23 NOTE — Progress Notes (Signed)
Scheduled patient while in office for bilateral diagnostic mammogram with right breast ultrasound at [facility] on 08/01/2015 at 10:15 am. Placed in mammogram hold.

## 2015-07-23 NOTE — Progress Notes (Signed)
   Subjective:   70 y.o. Divorced Caucasian female presents for evaluation of right breast mass. Onset of the symptoms was in the past week.. Patient sought evaluation because of breast lump.  Contributing factors include mom with breast CA. Denies none.. Patient denies history of trauma, bites, or injuries. Last mammogram was 06/22/15.  Patient was scheduling mammogram and was told she needed to be seen prior if she noted anything the breast.  Review of Systems Pertinent items are noted in HPI.  Objective:   General appearance: alert, cooperative, appears stated age and no distress  Breasts: normal appearance, no masses or tenderness, No nipple retraction or dimpling, No nipple discharge or bleeding, No axillary or supraclavicular adenopathy, positive findings: at 6 o'clock at edge of breast in scar area from  implant removal? mobile oval mass, non tender  Assessment:   ASSESSMENT:Patient is diagnosed with right breast mass   Plan:  Discussed finding with patient of breast mass and need for evaluation with diagnostic mammogram and [place]. Will be scheduled prior to leaving today.

## 2015-07-24 NOTE — Progress Notes (Signed)
Encounter reviewed Janice Hoffmann, MD   

## 2015-08-01 ENCOUNTER — Encounter: Payer: Self-pay | Admitting: Certified Nurse Midwife

## 2015-08-08 ENCOUNTER — Telehealth: Payer: Self-pay

## 2015-08-08 NOTE — Telephone Encounter (Signed)
Non-Urgent Medical Question     From   [name]    To   Janice Welch, CNM    Sent   08/01/2015 12:53 PM       Hi, Miss Janice Welch,   Wanted to let you know that I went for diagnostic mammogram and ultrasound at [facility] today. I was told that the lump is residual silicone (they think) from when I had my implants removed seven years ago. It surprised me because the reason I knew the implant was ruptured is because I had a lump then in exactly the same place as this one....why it should show up now is a mystery to me, but I guess it happens. The doctor said it didn't need biopsy and that I don't have to go for another mammogram for a year. However, he asked me to tell you what he said and to ask you to monitor it.    Thanks! Hope you're having a good day!   Janice Welch CNM patient sent the mychart message above regarding her recent diagnostic mammogram and ultrasound of her right breast with [facility]. She was seen in the office on 07/23/2015 for this. Would you like for me to schedule her for a 6 week recheck around 09/03/2015?

## 2015-08-08 NOTE — Telephone Encounter (Signed)
yes

## 2015-08-08 NOTE — Telephone Encounter (Signed)
Left message to call Kaitlyn at [phone]. 

## 2015-08-08 NOTE — Telephone Encounter (Signed)
Telephone encounter created to discuss mychart message with Deborah Leonard CNM. 

## 2015-08-09 NOTE — Telephone Encounter (Signed)
Patient returned call and she is agreeable to schedule breast check.  Scheduled office visit for 09/05/15 with Melvia Heaps CNM.  Patient to call back with any concerns prior to appointment.  Routing to provider for final review. Patient agreeable to disposition. Will close encounter.

## 2015-09-05 ENCOUNTER — Ambulatory Visit (INDEPENDENT_AMBULATORY_CARE_PROVIDER_SITE_OTHER): Payer: Medicare Other | Admitting: Certified Nurse Midwife

## 2015-09-05 ENCOUNTER — Encounter: Payer: Self-pay | Admitting: Certified Nurse Midwife

## 2015-09-05 VITALS — BP 100/64 | HR 68 | Resp 16 | Ht 64.25 in | Wt 140.0 lb

## 2015-09-05 DIAGNOSIS — N6310 Unspecified lump in the right breast, unspecified quadrant: Secondary | ICD-10-CM

## 2015-09-05 DIAGNOSIS — N63 Unspecified lump in breast: Secondary | ICD-10-CM | POA: Diagnosis not present

## 2015-09-05 NOTE — Patient Instructions (Signed)
Breast Self-Awareness Practicing breast self-awareness may pick up problems early, prevent significant medical complications, and possibly save your life. By practicing breast self-awareness, you can become familiar with how your breasts look and feel and if your breasts are changing. This allows you to notice changes early. It can also offer you some reassurance that your breast health is good. One way to learn what is normal for your breasts and whether your breasts are changing is to do a breast self-exam. If you find a lump or something that was not present in the past, it is best to contact your caregiver right away. Other findings that should be evaluated by your caregiver include nipple discharge, especially if it is bloody; skin changes or reddening; areas where the skin seems to be pulled in (retracted); or new lumps and bumps. Breast pain is seldom associated with cancer (malignancy), but should also be evaluated by a caregiver. HOW TO PERFORM A BREAST SELF-EXAM The best time to examine your breasts is 5-7 days after your menstrual period is over. During menstruation, the breasts are lumpier, and it may be more difficult to pick up changes. If you do not menstruate, have reached menopause, or had your uterus removed (hysterectomy), you should examine your breasts at regular intervals, such as monthly. If you are breastfeeding, examine your breasts after a feeding or after using a breast pump. Breast implants do not decrease the risk for lumps or tumors, so continue to perform breast self-exams as recommended. Talk to your caregiver about how to determine the difference between the implant and breast tissue. Also, talk about the amount of pressure you should use during the exam. Over time, you will become more familiar with the variations of your breasts and more comfortable with the exam. A breast self-exam requires you to remove all your clothes above the waist. 1. Look at your breasts and nipples.  Stand in front of a mirror in a room with good lighting. With your hands on your hips, push your hands firmly downward. Look for a difference in shape, contour, and size from one breast to the other (asymmetry). Asymmetry includes puckers, dips, or bumps. Also, look for skin changes, such as reddened or scaly areas on the breasts. Look for nipple changes, such as discharge, dimpling, repositioning, or redness. 2. Carefully feel your breasts. This is best done either in the shower or tub while using soapy water or when flat on your back. Place the arm (on the side of the breast you are examining) above your head. Use the pads (not the fingertips) of your three middle fingers on your opposite hand to feel your breasts. Start in the underarm area and use  inch (2 cm) overlapping circles to feel your breast. Use 3 different levels of pressure (light, medium, and firm pressure) at each circle before moving to the next circle. The light pressure is needed to feel the tissue closest to the skin. The medium pressure will help to feel breast tissue a little deeper, while the firm pressure is needed to feel the tissue close to the ribs. Continue the overlapping circles, moving downward over the breast until you feel your ribs below your breast. Then, move one finger-width towards the center of the body. Continue to use the  inch (2 cm) overlapping circles to feel your breast as you move slowly up toward the collar bone (clavicle) near the base of the neck. Continue the up and down exam using all 3 pressures until you reach the   middle of the chest. Do this with each breast, carefully feeling for lumps or changes. 3.  Keep a written record with breast changes or normal findings for each breast. By writing this information down, you do not need to depend only on memory for size, tenderness, or location. Write down where you are in your menstrual cycle, if you are still menstruating. Breast tissue can have some lumps or  thick tissue. However, see your caregiver if you find anything that concerns you.  SEEK MEDICAL CARE IF:  You see a change in shape, contour, or size of your breasts or nipples.   You see skin changes, such as reddened or scaly areas on the breasts or nipples.   You have an unusual discharge from your nipples.   You feel a new lump or unusually thick areas.    This information is not intended to replace advice given to you by your health care provider. Make sure you discuss any questions you have with your health care provider.   Document Released: 04/27/2005 Document Revised: 04/13/2012 Document Reviewed: 08/12/2011 Elsevier Interactive Patient Education 2016 [employer]  

## 2015-09-05 NOTE — Progress Notes (Signed)
   Subjective:   71 y.o. 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 Patient aware of rupture 5 years prior. Family history of breast cancer with mother. Patient denies any problems with area. No other health concerns today.  Review of Systems Pertinent items are noted in HPI.  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 Not symptomatic  Normal left breast exam  Plan:   PLAN: Discussed with patient recommendation of being seen by plastic surgeon for evaluation. Patient agreeable and will call MD that did the removal and have follow up there. She will advise recommendations given. Copy of diagnostic mammogram and [place] given to patient to take with her. Continue SBE and if changes needs to be evaluated. Repeat mammogram one year. RV prn

## 2015-09-06 NOTE — Progress Notes (Signed)
Encounter reviewed Kai Railsback, MD   

## 2016-05-25 DIAGNOSIS — Z0000 Encounter for general adult medical examination without abnormal findings: Secondary | ICD-10-CM | POA: Diagnosis not present

## 2016-05-25 DIAGNOSIS — R7309 Other abnormal glucose: Secondary | ICD-10-CM | POA: Diagnosis not present

## 2016-05-25 DIAGNOSIS — E784 Other hyperlipidemia: Secondary | ICD-10-CM | POA: Diagnosis not present

## 2016-05-25 DIAGNOSIS — E559 Vitamin D deficiency, unspecified: Secondary | ICD-10-CM | POA: Diagnosis not present

## 2016-06-01 DIAGNOSIS — Z1389 Encounter for screening for other disorder: Secondary | ICD-10-CM | POA: Diagnosis not present

## 2016-06-01 DIAGNOSIS — Z0000 Encounter for general adult medical examination without abnormal findings: Secondary | ICD-10-CM | POA: Diagnosis not present

## 2016-06-01 DIAGNOSIS — L408 Other psoriasis: Secondary | ICD-10-CM | POA: Diagnosis not present

## 2016-06-01 DIAGNOSIS — R7309 Other abnormal glucose: Secondary | ICD-10-CM | POA: Diagnosis not present

## 2016-06-01 DIAGNOSIS — H20819 Fuchs' heterochromic cyclitis, unspecified eye: Secondary | ICD-10-CM | POA: Diagnosis not present

## 2016-06-01 DIAGNOSIS — J302 Other seasonal allergic rhinitis: Secondary | ICD-10-CM | POA: Diagnosis not present

## 2016-06-01 DIAGNOSIS — K635 Polyp of colon: Secondary | ICD-10-CM | POA: Diagnosis not present

## 2016-06-01 DIAGNOSIS — E784 Other hyperlipidemia: Secondary | ICD-10-CM | POA: Diagnosis not present

## 2016-06-01 DIAGNOSIS — F3341 Major depressive disorder, recurrent, in partial remission: Secondary | ICD-10-CM | POA: Diagnosis not present

## 2016-06-01 DIAGNOSIS — Z6823 Body mass index (BMI) 23.0-23.9, adult: Secondary | ICD-10-CM | POA: Diagnosis not present

## 2016-06-01 DIAGNOSIS — H04129 Dry eye syndrome of unspecified lacrimal gland: Secondary | ICD-10-CM | POA: Diagnosis not present

## 2016-06-01 DIAGNOSIS — R7989 Other specified abnormal findings of blood chemistry: Secondary | ICD-10-CM | POA: Diagnosis not present

## 2016-06-15 DIAGNOSIS — Z1211 Encounter for screening for malignant neoplasm of colon: Secondary | ICD-10-CM | POA: Diagnosis not present

## 2016-06-15 DIAGNOSIS — Z1212 Encounter for screening for malignant neoplasm of rectum: Secondary | ICD-10-CM | POA: Diagnosis not present

## 2016-06-16 ENCOUNTER — Ambulatory Visit: Payer: Medicare Other | Admitting: Certified Nurse Midwife

## 2016-06-16 DIAGNOSIS — Z1212 Encounter for screening for malignant neoplasm of rectum: Secondary | ICD-10-CM | POA: Diagnosis not present

## 2016-07-14 ENCOUNTER — Encounter: Payer: Self-pay | Admitting: Certified Nurse Midwife

## 2016-07-14 ENCOUNTER — Ambulatory Visit (INDEPENDENT_AMBULATORY_CARE_PROVIDER_SITE_OTHER): Payer: Medicare Other | Admitting: Certified Nurse Midwife

## 2016-07-14 VITALS — BP 100/64 | HR 68 | Resp 16 | Ht 64.25 in | Wt 139.0 lb

## 2016-07-14 DIAGNOSIS — Z01419 Encounter for gynecological examination (general) (routine) without abnormal findings: Secondary | ICD-10-CM | POA: Diagnosis not present

## 2016-07-14 DIAGNOSIS — Z124 Encounter for screening for malignant neoplasm of cervix: Secondary | ICD-10-CM

## 2016-07-14 DIAGNOSIS — N814 Uterovaginal prolapse, unspecified: Secondary | ICD-10-CM | POA: Diagnosis not present

## 2016-07-14 DIAGNOSIS — M069 Rheumatoid arthritis, unspecified: Secondary | ICD-10-CM | POA: Insufficient documentation

## 2016-07-14 NOTE — Patient Instructions (Signed)
EXERCISE AND DIET:  We recommended that you start or continue a regular exercise program for good health. Regular exercise means any activity that makes your heart beat faster and makes you sweat.  We recommend exercising at least 30 minutes per day at least 3 days a week, preferably 4 or 5.  We also recommend a diet low in fat and sugar.  Inactivity, poor dietary choices and obesity can cause diabetes, heart attack, stroke, and kidney damage, among others.    ALCOHOL AND SMOKING:  Women should limit their alcohol intake to no more than 7 drinks/beers/glasses of wine (combined, not each!) per week. Moderation of alcohol intake to this level decreases your risk of breast cancer and liver damage. And of course, no recreational drugs are part of a healthy lifestyle.  And absolutely no smoking or even second hand smoke. Most people know smoking can cause heart and lung diseases, but did you know it also contributes to weakening of your bones? Aging of your skin?  Yellowing of your teeth and nails?  CALCIUM AND VITAMIN D:  Adequate intake of calcium and Vitamin D are recommended.  The recommendations for exact amounts of these supplements seem to change often, but generally speaking 600 mg of calcium (either carbonate or citrate) and 800 units of Vitamin D per day seems prudent. Certain women may benefit from higher intake of Vitamin D.  If you are among these women, your doctor will have told you during your visit.    PAP SMEARS:  Pap smears, to check for cervical cancer or precancers,  have traditionally been done yearly, although recent scientific advances have shown that most women can have pap smears less often.  However, every woman still should have a physical exam from her gynecologist every year. It will include a breast check, inspection of the vulva and vagina to check for abnormal growths or skin changes, a visual exam of the cervix, and then an exam to evaluate the size and shape of the uterus and  ovaries.  And after 70 years of age, a rectal exam is indicated to check for rectal cancers. We will also provide age appropriate advice regarding health maintenance, like when you should have certain vaccines, screening for sexually transmitted diseases, bone density testing, colonoscopy, mammograms, etc.   MAMMOGRAMS:  All women over 40 years old should have a yearly mammogram. Many facilities now offer a "3D" mammogram, which may cost around $50 extra out of pocket. If possible,  we recommend you accept the option to have the 3D mammogram performed.  It both reduces the number of women who will be called back for extra views which then turn out to be normal, and it is better than the routine mammogram at detecting truly abnormal areas.    COLONOSCOPY:  Colonoscopy to screen for colon cancer is recommended for all women at age 50.  We know, you hate the idea of the prep.  We agree, BUT, having colon cancer and not knowing it is worse!!  Colon cancer so often starts as a polyp that can be seen and removed at colonscopy, which can quite literally save your life!  And if your first colonoscopy is normal and you have no family history of colon cancer, most women don't have to have it again for 10 years.  Once every ten years, you can do something that may end up saving your life, right?  We will be happy to help you get it scheduled when you are ready.    Be sure to check your insurance coverage so you understand how much it will cost.  It may be covered as a preventative service at no cost, but you should check your particular policy.     About Cystocele  Overview  The pelvic organs, including the bladder, are normally supported by pelvic floor muscles and ligaments.  When these muscles and ligaments are stretched, weakened or torn, the wall between the bladder and the vagina sags or herniates causing a prolapse, sometimes called a cystocele.  This condition may cause discomfort and problems with emptying  the bladder.  It can be present in various stages.  Some people are not aware of the changes.  Others may notice changes at the vaginal opening or a feeling of the bladder dropping outside the body.  Causes of a Cystocele  A cystocele is usually caused by muscle straining or stretching during childbirth.  In addition, cystocele is more common after menopause, because the hormone estrogen helps keep the elastic tissues around the pelvic organs strong.  A cystocele is more likely to occur when levels of estrogen decrease.  Other causes include: heavy lifting, chronic coughing, previous pelvic surgery and obesity.  Symptoms  A bladder that has dropped from its normal position may cause: unwanted urine leakage (stress incontinence), frequent urination or urge to urinate, incomplete emptying of the bladder (not feeling bladder relief after emptying), pain or discomfort in the vagina, pelvis, groin, lower back or lower abdomen and frequent urinary tract infections.  Mild cases may not cause any symptoms.  Treatment Options  Pelvic floor (Kegel) exercises:  Strength training the muscles in your genital area  Behavioral changes: Treating and preventing constipation, taking time to empty your bladder properly, learning to lift properly and/or avoid heavy lifting when possible, stopping smoking, avoiding weight gain and treating a chronic cough or bronchitis.  A pessary: A vaginal support device is sometimes used to help pelvic support caused by muscle and ligament changes.  Surgery: Surgical repair may be necessary if symptoms cannot be managed with exercise, behavioral changes and a pessary.  Surgery is usually considered for severe cases.   2007, [employer] 

## 2016-07-14 NOTE — Progress Notes (Signed)
71 y.o. [id number] Divorced  Caucasian Fe here for annual exam. Menopausal no HRT. Denies vaginal bleeding or vaginal dryness. Patient using pessary for vaginal support,she did not bring in today for fit check. She feels the bladder prolapse may have increased, but no incontinence. Sees PCP for aex, labs, cholesterol management and bone density. Seeing Rheumatology now for RA, on Methotrexate now. Will be having a BMD soon. Colonoscopy due but was sent Cologard which was negative. No other health issues today.  Patient's last menstrual period was 05/11/1992.          Sexually active: No.  The current method of family planning is post menopausal status.    Exercising: Yes.    walking Smoker:  no  Health Maintenance: Pap:  06-13-15 neg, 05-31-13 neg HPV HR neg, hx of cryo MMG:  08-01-15 bilateral & rt breast u/s category 2 benign Colonoscopy:  2012 polyp f/u 83yrs, cologard 2018 neg per patient BMD: 05-25-12 TDaP:  2014 Shingles: had done Pneumonia: 19yrs ago Hep C and HIV: neg per patient Labs: pcp Self breast exam: done occ   reports that she quit smoking about 5 years ago. She has never used smokeless tobacco. She reports that she does not drink alcohol or use drugs.  Past Medical History:  Diagnosis Date  . Abnormal Pap smear of cervix   . Anxiety   . Colitis   . First degree uterine prolapse 06/2013  . History of anemia   . Hypercholesteremia   . MVP (mitral valve prolapse)   . Psoriatic arthritis ([place])   . Urinary incontinence   . Uveitis   . Vitreous detachment, bilateral     Past Surgical History:  Procedure Laterality Date  . BREAST ENHANCEMENT SURGERY  1979  . BREAST IMPLANT REMOVAL  2010  . CRYOTHERAPY    . TONSILLECTOMY AND ADENOIDECTOMY  1954    Current Outpatient Prescriptions  Medication Sig Dispense Refill  . atorvastatin (LIPITOR) 10 MG tablet Take 10 mg by mouth daily.    . B Complex Vitamins (B COMPLEX PO) Take by mouth daily.    Marland Kitchen BIOTIN PO Take by mouth.     Marland Kitchen CHIA SEED PO Take by mouth daily.    . Cholecalciferol (VITAMIN D PO) Take 5,000 Int'l Units by mouth daily.     . Cyanocobalamin (VITAMIN B 12 PO) Take 2,000 mg by mouth.    . fexofenadine (ALLEGRA) 180 MG tablet Take 180 mg by mouth daily.    . Flaxseed, Linseed, (FLAXSEED OIL PO) Take by mouth daily.    Marland Kitchen FOLIC ACID PO Take [id number] mg by mouth daily.     . LUTEIN PO Take by mouth daily.    Marland Kitchen MAGNESIUM PO Take by mouth daily.    . Melatonin 5 MG CAPS Take 5 mg by mouth daily.    . methotrexate (RHEUMATREX) 2.5 MG tablet TK 6 TS PO 1 TIME A WK  3  . Methylsulfonylmethane (MSM PO) Take by mouth.    . Multiple Vitamins-Minerals (MULTIVITAMIN PO) Take by mouth.    . Multiple Vitamins-Minerals (ZINC PO) Take by mouth daily.    . Omega-3 Fatty Acids (OMEGA-3 FISH OIL PO) Take by mouth.    . Probiotic Product (PROBIOTIC PO) Take by mouth daily.    . sertraline (ZOLOFT) 50 MG tablet Take 50 mg by mouth daily.    Marland Kitchen UNABLE TO FIND daily. mk-7 vitamin k-2    . UNABLE TO FIND daily. Oil of oregano with p73  No current facility-administered medications for this visit.     Family History  Problem Relation Age of Onset  . Breast cancer Mother   . Osteoporosis Mother   . Hypertension Father   . Aneurysm Father   . Pancreatic cancer Maternal Grandmother   . Diabetes Paternal Grandfather     Type 2    ROS:  Pertinent items are noted in HPI.  Otherwise, a comprehensive ROS was negative.  Exam:   BP 100/64   Pulse 68   Resp 16   Ht 5' 4.25" (1.632 m)   Wt 139 lb (63 kg)   LMP 05/11/1992   BMI 23.67 kg/m  Height: 5' 4.25" (163.2 cm) Ht Readings from Last 3 Encounters:  07/14/16 5' 4.25" (1.632 m)  09/05/15 5' 4.25" (1.632 m)  07/23/15 5' 4.25" (1.632 m)    General appearance: alert, cooperative and appears stated age Head: Normocephalic, without obvious abnormality, atraumatic Neck: no adenopathy, supple, symmetrical, trachea midline and thyroid normal to inspection and  palpation Lungs: clear to auscultation bilaterally Breasts: normal appearance, no masses or tenderness, No nipple retraction or dimpling, No nipple discharge or bleeding, No axillary or supraclavicular adenopathy Heart: regular rate and rhythm Abdomen: soft, non-tender; no masses,  no organomegaly Extremities: extremities normal, atraumatic, no cyanosis or edema Skin: Skin color, texture, turgor normal. No rashes or lesions Lymph nodes: Cervical, supraclavicular, and axillary nodes normal. No abnormal inguinal nodes palpated Neurologic: Grossly normal   Pelvic: External genitalia:  no lesions              Urethra:  normal appearing urethra with no masses, tenderness or lesions              Bartholin's and Skene's: normal                 Vagina: normal appearing vagina with normal color and discharge, no lesions, cystocele grade 2-3, no ulcerations or abrasions noted in vagina. Had patient to stand and viewed prolapse as complete with no support              Cervix: no cervical motion tenderness, no lesions and normal appearance              Pap taken: No. Bimanual Exam:  Uterus:  normal size, contour, position, consistency, mobility, non-tender and prolapsed complete              Adnexa: normal adnexa and no mass, fullness, tenderness               Rectovaginal: Confirms               Anus:  normal sphincter tone, no lesions  Chaperone present: yes  A:  Well Woman with normal exam  Menopausal no HRT  Cystocele grade 2-3 with complete uterine prolapse uses Pessary prn and coconut oil for dryness  Cholesterol and new diagnosis of RA with MD management  Colonoscopy due patient will be scheduled by PCP, declines our scheduling  P:   Reviewed health and wellness pertinent to exam  Aware of need to advise if vaginal bleeding  Discussed appearance of prolapse and feel per her description she may need pessary size change for better support. Patient will decide if she wants to do another  pessary check for sized( has not been done in last 2 years did not bring pessary) and will schedule. Instructed to wear pessary in.  Continue follow up with MD as recommended  Pap smear as above not  taken   counseled on breast self exam, mammography screening, menopause, adequate intake of calcium and vitamin D, diet and exercise, Kegel's exercises  return annually or prn  An After Visit Summary was printed and given to the patient.

## 2016-07-16 NOTE — Progress Notes (Signed)
Encounter reviewed Jill Jertson, MD   

## 2016-07-22 DIAGNOSIS — Z1231 Encounter for screening mammogram for malignant neoplasm of breast: Secondary | ICD-10-CM | POA: Diagnosis not present

## 2016-07-22 DIAGNOSIS — Z803 Family history of malignant neoplasm of breast: Secondary | ICD-10-CM | POA: Diagnosis not present

## 2016-07-28 DIAGNOSIS — L4059 Other psoriatic arthropathy: Secondary | ICD-10-CM | POA: Diagnosis not present

## 2016-07-28 DIAGNOSIS — L409 Psoriasis, unspecified: Secondary | ICD-10-CM | POA: Diagnosis not present

## 2016-07-28 DIAGNOSIS — Z6823 Body mass index (BMI) 23.0-23.9, adult: Secondary | ICD-10-CM | POA: Diagnosis not present

## 2016-07-28 DIAGNOSIS — H209 Unspecified iridocyclitis: Secondary | ICD-10-CM | POA: Diagnosis not present

## 2016-07-30 DIAGNOSIS — N6489 Other specified disorders of breast: Secondary | ICD-10-CM | POA: Diagnosis not present

## 2016-07-30 DIAGNOSIS — N6321 Unspecified lump in the left breast, upper outer quadrant: Secondary | ICD-10-CM | POA: Diagnosis not present

## 2016-08-26 DIAGNOSIS — L4059 Other psoriatic arthropathy: Secondary | ICD-10-CM | POA: Diagnosis not present

## 2016-08-26 DIAGNOSIS — M545 Low back pain: Secondary | ICD-10-CM | POA: Diagnosis not present

## 2016-08-26 DIAGNOSIS — L409 Psoriasis, unspecified: Secondary | ICD-10-CM | POA: Diagnosis not present

## 2016-08-26 DIAGNOSIS — H209 Unspecified iridocyclitis: Secondary | ICD-10-CM | POA: Diagnosis not present

## 2016-08-26 DIAGNOSIS — Z6823 Body mass index (BMI) 23.0-23.9, adult: Secondary | ICD-10-CM | POA: Diagnosis not present

## 2016-09-02 DIAGNOSIS — M6281 Muscle weakness (generalized): Secondary | ICD-10-CM | POA: Diagnosis not present

## 2016-09-02 DIAGNOSIS — M545 Low back pain: Secondary | ICD-10-CM | POA: Diagnosis not present

## 2016-09-03 ENCOUNTER — Encounter: Payer: Self-pay | Admitting: Certified Nurse Midwife

## 2016-09-04 DIAGNOSIS — M6281 Muscle weakness (generalized): Secondary | ICD-10-CM | POA: Diagnosis not present

## 2016-09-04 DIAGNOSIS — M545 Low back pain: Secondary | ICD-10-CM | POA: Diagnosis not present

## 2016-09-09 DIAGNOSIS — M6281 Muscle weakness (generalized): Secondary | ICD-10-CM | POA: Diagnosis not present

## 2016-09-09 DIAGNOSIS — M545 Low back pain: Secondary | ICD-10-CM | POA: Diagnosis not present

## 2016-09-11 DIAGNOSIS — M6281 Muscle weakness (generalized): Secondary | ICD-10-CM | POA: Diagnosis not present

## 2016-09-11 DIAGNOSIS — M545 Low back pain: Secondary | ICD-10-CM | POA: Diagnosis not present

## 2016-09-16 DIAGNOSIS — M6281 Muscle weakness (generalized): Secondary | ICD-10-CM | POA: Diagnosis not present

## 2016-09-16 DIAGNOSIS — M545 Low back pain: Secondary | ICD-10-CM | POA: Diagnosis not present

## 2016-09-18 DIAGNOSIS — M545 Low back pain: Secondary | ICD-10-CM | POA: Diagnosis not present

## 2016-09-18 DIAGNOSIS — M6281 Muscle weakness (generalized): Secondary | ICD-10-CM | POA: Diagnosis not present

## 2016-09-23 DIAGNOSIS — M545 Low back pain: Secondary | ICD-10-CM | POA: Diagnosis not present

## 2016-09-23 DIAGNOSIS — M6281 Muscle weakness (generalized): Secondary | ICD-10-CM | POA: Diagnosis not present

## 2016-10-02 DIAGNOSIS — M545 Low back pain: Secondary | ICD-10-CM | POA: Diagnosis not present

## 2016-10-02 DIAGNOSIS — M6281 Muscle weakness (generalized): Secondary | ICD-10-CM | POA: Diagnosis not present

## 2016-10-28 DIAGNOSIS — H209 Unspecified iridocyclitis: Secondary | ICD-10-CM | POA: Diagnosis not present

## 2016-10-28 DIAGNOSIS — L4059 Other psoriatic arthropathy: Secondary | ICD-10-CM | POA: Diagnosis not present

## 2016-10-28 DIAGNOSIS — M545 Low back pain: Secondary | ICD-10-CM | POA: Diagnosis not present

## 2016-10-28 DIAGNOSIS — L409 Psoriasis, unspecified: Secondary | ICD-10-CM | POA: Diagnosis not present

## 2016-10-28 DIAGNOSIS — Z6821 Body mass index (BMI) 21.0-21.9, adult: Secondary | ICD-10-CM | POA: Diagnosis not present

## 2016-11-05 ENCOUNTER — Other Ambulatory Visit (HOSPITAL_COMMUNITY): Payer: Self-pay | Admitting: Internal Medicine

## 2016-11-05 ENCOUNTER — Ambulatory Visit (HOSPITAL_COMMUNITY): Payer: Self-pay

## 2016-11-05 ENCOUNTER — Other Ambulatory Visit: Payer: Self-pay | Admitting: Internal Medicine

## 2016-11-05 DIAGNOSIS — R1011 Right upper quadrant pain: Secondary | ICD-10-CM

## 2016-11-05 DIAGNOSIS — R1013 Epigastric pain: Secondary | ICD-10-CM

## 2016-11-05 DIAGNOSIS — R112 Nausea with vomiting, unspecified: Secondary | ICD-10-CM | POA: Diagnosis not present

## 2016-11-05 DIAGNOSIS — R634 Abnormal weight loss: Secondary | ICD-10-CM | POA: Diagnosis not present

## 2016-11-05 DIAGNOSIS — D519 Vitamin B12 deficiency anemia, unspecified: Secondary | ICD-10-CM | POA: Diagnosis not present

## 2016-11-05 DIAGNOSIS — D649 Anemia, unspecified: Secondary | ICD-10-CM | POA: Diagnosis not present

## 2016-11-05 DIAGNOSIS — Z79899 Other long term (current) drug therapy: Secondary | ICD-10-CM | POA: Diagnosis not present

## 2016-11-05 DIAGNOSIS — R7301 Impaired fasting glucose: Secondary | ICD-10-CM | POA: Diagnosis not present

## 2016-11-06 ENCOUNTER — Ambulatory Visit
Admission: RE | Admit: 2016-11-06 | Discharge: 2016-11-06 | Disposition: A | Discharge status: Home / Self Care | Payer: Medicare Other | Source: Ambulatory Visit | Attending: Internal Medicine | Admitting: Internal Medicine

## 2016-11-06 ENCOUNTER — Other Ambulatory Visit: Payer: Self-pay | Admitting: Internal Medicine

## 2016-11-06 DIAGNOSIS — R1013 Epigastric pain: Secondary | ICD-10-CM

## 2016-11-06 DIAGNOSIS — R1011 Right upper quadrant pain: Secondary | ICD-10-CM

## 2016-11-06 DIAGNOSIS — R634 Abnormal weight loss: Secondary | ICD-10-CM | POA: Diagnosis not present

## 2016-11-06 DIAGNOSIS — D7389 Other diseases of spleen: Secondary | ICD-10-CM | POA: Diagnosis not present

## 2016-11-06 DIAGNOSIS — E279 Disorder of adrenal gland, unspecified: Secondary | ICD-10-CM | POA: Diagnosis not present

## 2016-11-06 DIAGNOSIS — E278 Other specified disorders of adrenal gland: Secondary | ICD-10-CM

## 2016-11-06 DIAGNOSIS — R935 Abnormal findings on diagnostic imaging of other abdominal regions, including retroperitoneum: Secondary | ICD-10-CM

## 2016-11-06 DIAGNOSIS — C259 Malignant neoplasm of pancreas, unspecified: Secondary | ICD-10-CM | POA: Diagnosis not present

## 2016-11-06 DIAGNOSIS — R112 Nausea with vomiting, unspecified: Secondary | ICD-10-CM

## 2016-11-06 DIAGNOSIS — R591 Generalized enlarged lymph nodes: Secondary | ICD-10-CM | POA: Diagnosis not present

## 2016-11-06 DIAGNOSIS — C8590 Non-Hodgkin lymphoma, unspecified, unspecified site: Secondary | ICD-10-CM | POA: Diagnosis not present

## 2016-11-06 DIAGNOSIS — K869 Disease of pancreas, unspecified: Secondary | ICD-10-CM | POA: Diagnosis not present

## 2016-11-06 IMAGING — US US ABDOMEN COMPLETE
1 series · 13 of 25 positions shown · non-contrast
Comparison: None.

CLINICAL DATA: Right upper quadrant and epigastric abdominal pain,
nausea and vomiting for the past 2 months. Family history of
pancreatic cancer in a grandmother. Taking methotrexate.

EXAM:
ABDOMEN ULTRASOUND COMPLETE

[Series 1: us abdomen complete · 0.22mm/px · 13 of 88 slices shown]
[im 1/88]
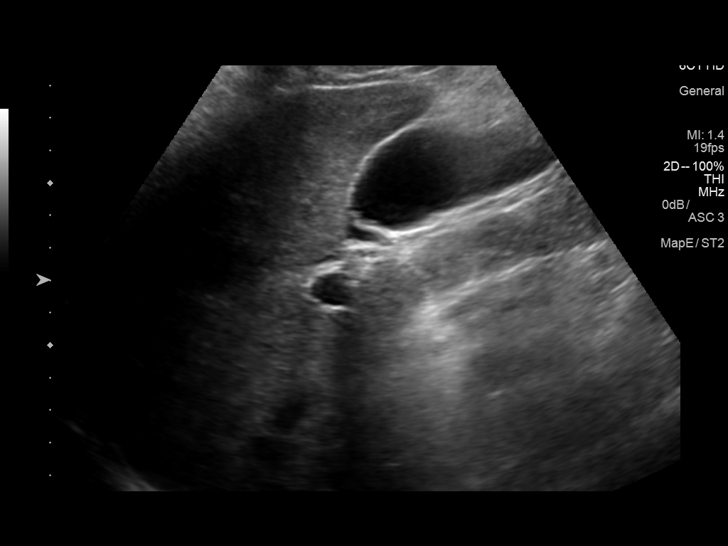
[im 8/88]
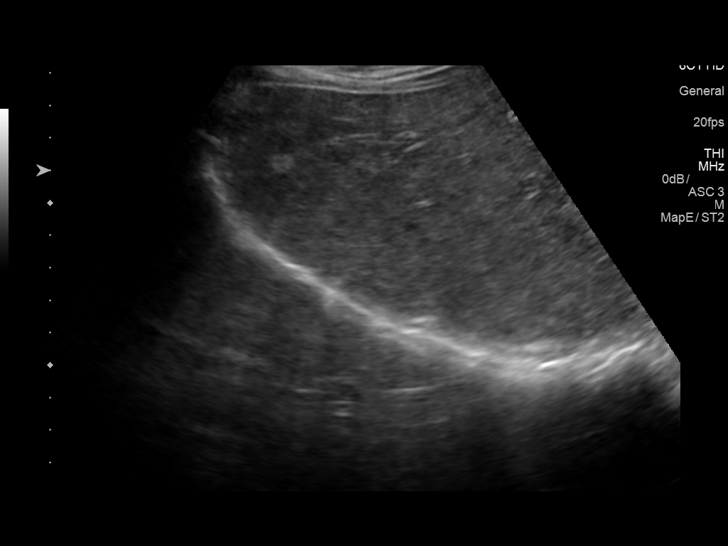
[im 15/88]
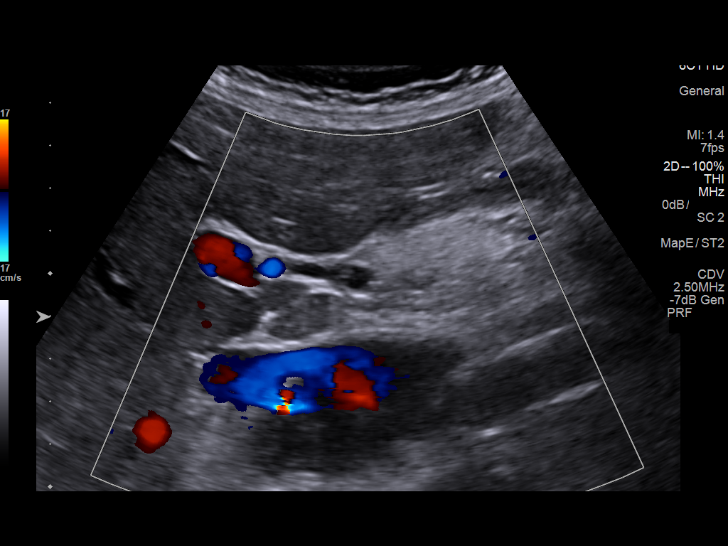
[im 22/88]
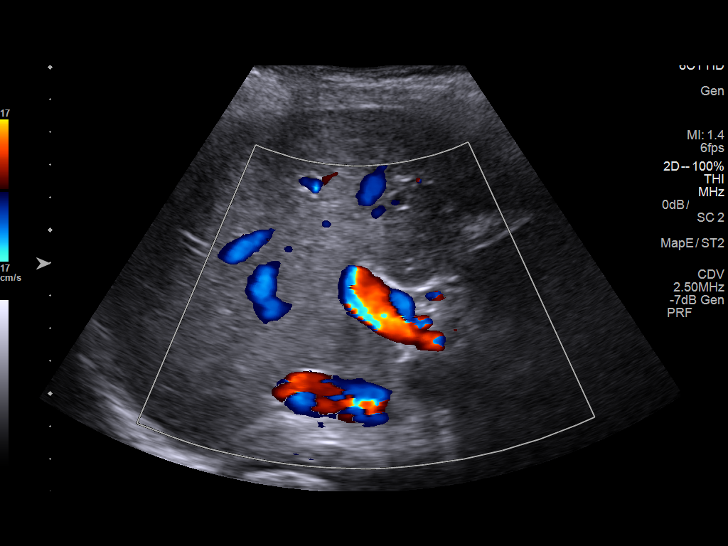
[im 30/88]
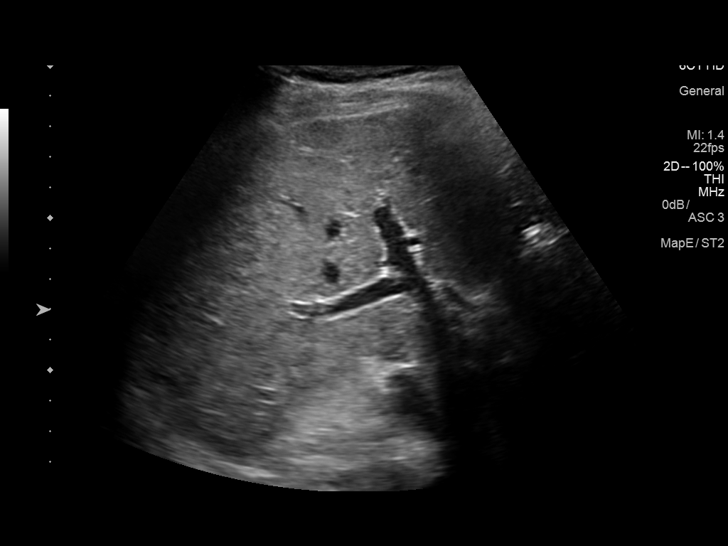
[im 37/88]
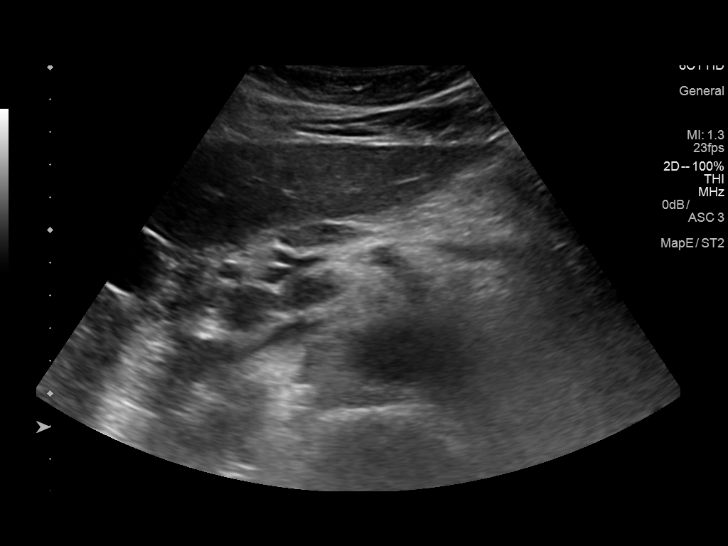
[im 44/88]
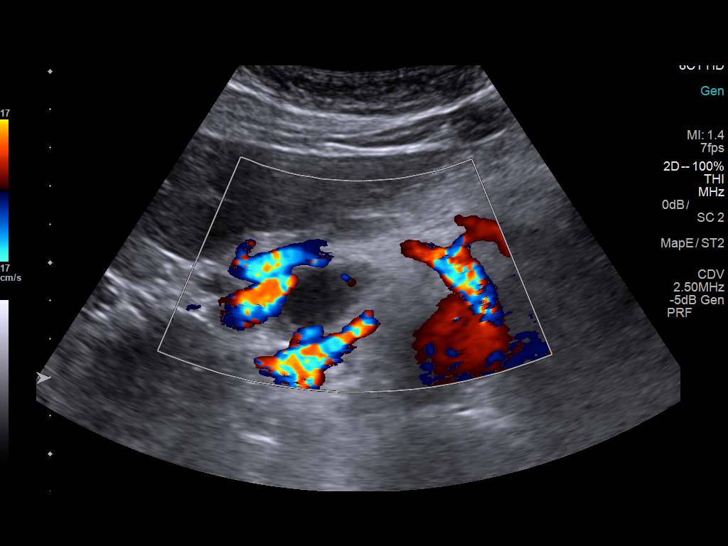
[im 51/88]
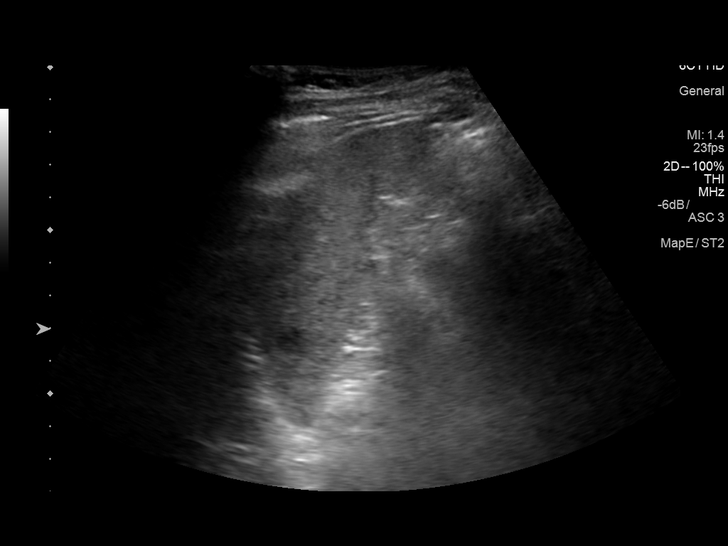
[im 59/88]
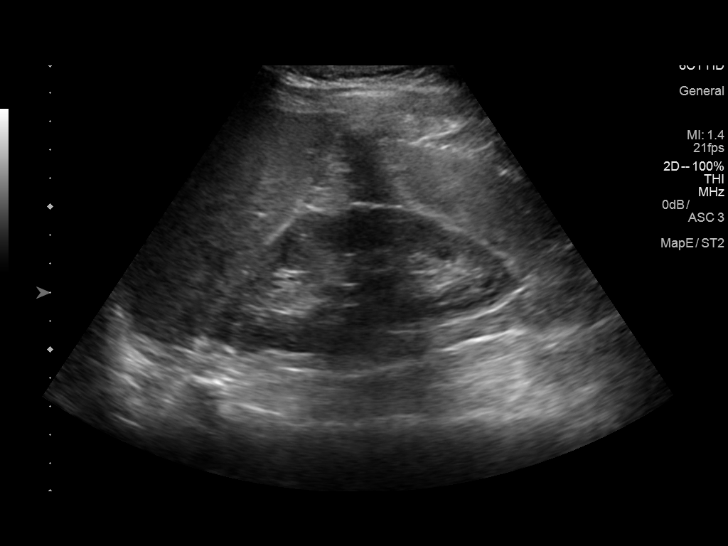
[im 66/88]
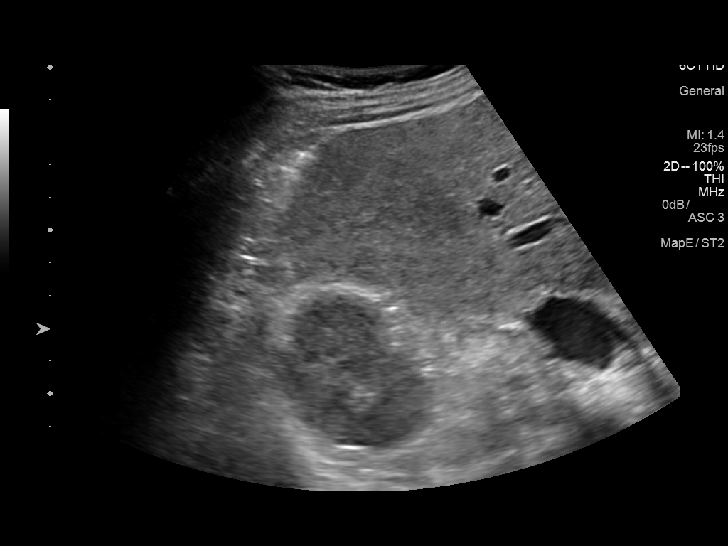
[im 73/88]
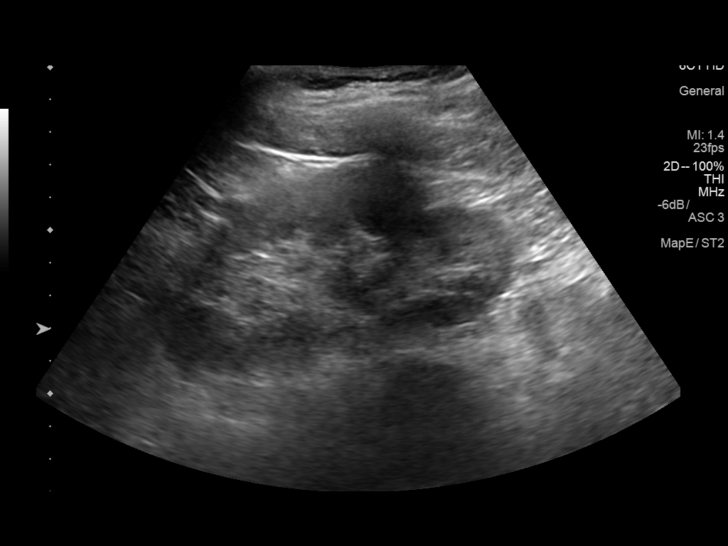
[im 80/88]
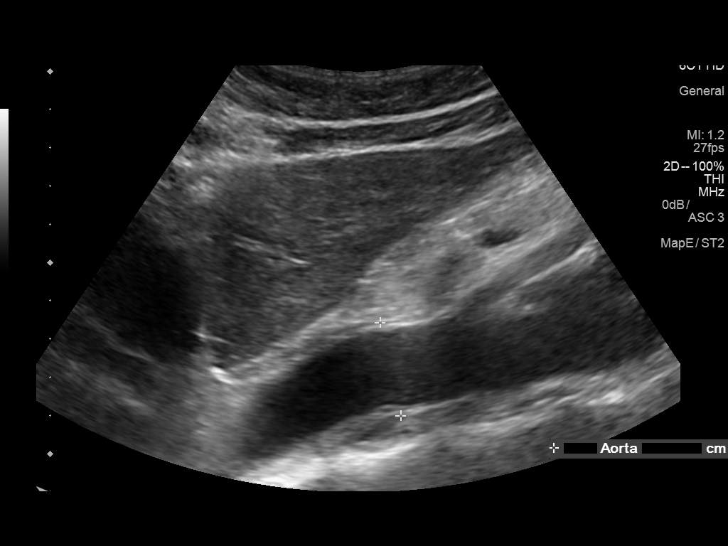
[im 88/88]
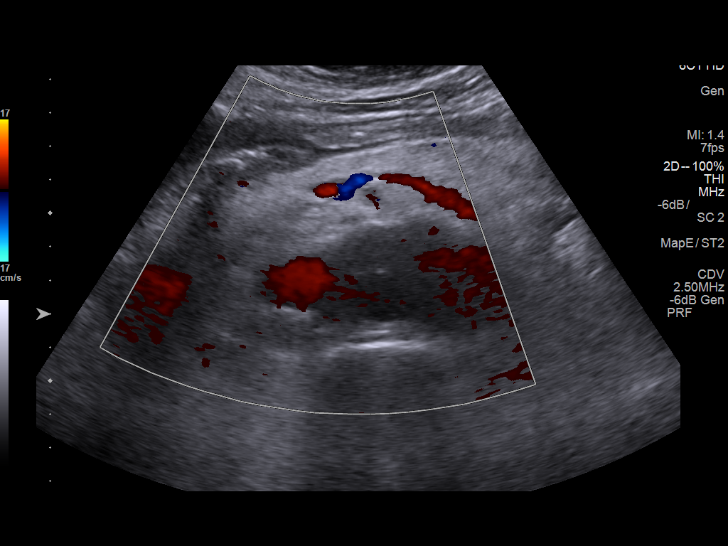

[13 of 25 positions shown; findings below may reference images not displayed]

FINDINGS: Gallbladder: No gallstones or wall thickening visualized. No
sonographic Murphy sign noted by sonographer.

Common bile duct: Diameter: 2.6 mm

Liver: Mildly heterogeneous. 1.0 x 0.8 x 0.6 cm oval, homogeneously
echogenic mass in the right lobe.

IVC: No abnormality visualized.

Pancreas: 2.9 x 2.6 x 1.9 cm oval, hypoechoic mass in the pancreatic
tail. There is a 1.9 x 1.4 x 1.4 cm similar area in the head of the
pancreas. In the axial plane, there is a 1.2 x 0.6 cm similar area
in the neck of the pancreas and adjacent 4 mm rounded similar area.

Spleen: Normal size and shape. 1.6 x 1.5 x 1.4 cm oval, hypoechoic
mass within the spleen. There is a 1.0 cm adjacent oval,
similar-appearing area in the spleen and a nearby 0.8 cm
similar-appearing area in the spleen.

Right Kidney: Length: 10.8 cm. Echogenicity within normal limits. No
mass or hydronephrosis visualized.

Left Kidney: Length: 11.3 cm. Echogenicity within normal limits. No
mass or hydronephrosis visualized.

Abdominal aorta: Atheromatous changes without aneurysm.

Other findings: Mildly enlarged peripancreatic lymph nodes. The
largest has a short axis diameter of 10 mm. There are also possible
mildly enlarged para-aortic lymph nodes.
IMPRESSION: 1. Pancreatic and splenic masses, concerning for metastases. Primary
pancreatic carcinoma is also a possibility.
2. Mild peripancreatic adenopathy and possible mild para-aortic
adenopathy, concerning for metastatic disease.
3. 1.0 cm echogenic mass in the right lobe of the liver, most likely
representing an hemangioma.
Recommendation: CT of the abdomen and pelvis with intravenous
contrast.

These results will be called to the ordering clinician or
representative by the Radiologist Assistant, and communication
documented in the PACS or zVision Dashboard.

## 2016-11-06 MED ORDER — IOPAMIDOL (ISOVUE-300) INJECTION 61%
100.0000 mL | Freq: Once | INTRAVENOUS | Status: AC | PRN
  Administered 2016-11-06: 100 mL via INTRAVENOUS

## 2016-11-09 ENCOUNTER — Encounter: Payer: Self-pay | Admitting: *Deleted

## 2016-11-09 ENCOUNTER — Other Ambulatory Visit (HOSPITAL_COMMUNITY): Payer: Self-pay | Admitting: Internal Medicine

## 2016-11-09 DIAGNOSIS — E278 Other specified disorders of adrenal gland: Secondary | ICD-10-CM

## 2016-11-09 DIAGNOSIS — R634 Abnormal weight loss: Secondary | ICD-10-CM

## 2016-11-09 DIAGNOSIS — R591 Generalized enlarged lymph nodes: Secondary | ICD-10-CM

## 2016-11-10 ENCOUNTER — Ambulatory Visit (HOSPITAL_COMMUNITY)
Admission: RE | Admit: 2016-11-10 | Discharge: 2016-11-10 | Disposition: A | Discharge status: Home / Self Care | Payer: Medicare Other | Source: Ambulatory Visit | Attending: Internal Medicine | Admitting: Internal Medicine

## 2016-11-10 ENCOUNTER — Ambulatory Visit: Payer: Medicare Other

## 2016-11-10 DIAGNOSIS — E279 Disorder of adrenal gland, unspecified: Secondary | ICD-10-CM | POA: Diagnosis present

## 2016-11-10 DIAGNOSIS — R634 Abnormal weight loss: Secondary | ICD-10-CM

## 2016-11-10 DIAGNOSIS — R591 Generalized enlarged lymph nodes: Secondary | ICD-10-CM | POA: Diagnosis present

## 2016-11-10 DIAGNOSIS — R590 Localized enlarged lymph nodes: Secondary | ICD-10-CM | POA: Diagnosis not present

## 2016-11-10 DIAGNOSIS — D7389 Other diseases of spleen: Secondary | ICD-10-CM | POA: Diagnosis not present

## 2016-11-10 DIAGNOSIS — K869 Disease of pancreas, unspecified: Secondary | ICD-10-CM | POA: Insufficient documentation

## 2016-11-10 DIAGNOSIS — E278 Other specified disorders of adrenal gland: Secondary | ICD-10-CM

## 2016-11-10 LAB — GLUCOSE, CAPILLARY: Glucose-Capillary: 106 mg/dL — ABNORMAL HIGH (ref 65–99)

## 2016-11-10 MED ORDER — FLUDEOXYGLUCOSE F - 18 (FDG) INJECTION
6.4000 | Freq: Once | INTRAVENOUS | Status: AC | PRN
  Administered 2016-11-10: 6.4 via INTRAVENOUS

## 2016-11-12 DIAGNOSIS — K869 Disease of pancreas, unspecified: Secondary | ICD-10-CM | POA: Diagnosis not present

## 2016-11-12 DIAGNOSIS — R591 Generalized enlarged lymph nodes: Secondary | ICD-10-CM | POA: Diagnosis not present

## 2016-11-13 ENCOUNTER — Telehealth: Payer: Self-pay | Admitting: Gastroenterology

## 2016-11-13 ENCOUNTER — Other Ambulatory Visit: Payer: Self-pay

## 2016-11-13 DIAGNOSIS — K8689 Other specified diseases of pancreas: Secondary | ICD-10-CM

## 2016-11-13 NOTE — Telephone Encounter (Signed)
EUS 11/19/16 130 EUS scheduled, pt instructed and medications reviewed.  Patient instructions sent via My Chart.  Patient to call with any questions or concerns.

## 2016-11-13 NOTE — Telephone Encounter (Signed)
Patty, I spoke with Dr. Virgina Jock last night. She has extensive adenopathy (most consistent with lymphoma) getting LN biopsy today I believe. Also has mass in pancreas that may be related or may be a second process.  She needs upper EUS radial +/- linear next Thursday (12th). If no MAC availability at end of my day as add on, please let me know. We'll have to bump one of the last colonoscopies to fit her in because her case is quite urgent.  Thanks

## 2016-11-17 ENCOUNTER — Encounter (HOSPITAL_COMMUNITY): Payer: Self-pay | Admitting: *Deleted

## 2016-11-18 DIAGNOSIS — R591 Generalized enlarged lymph nodes: Secondary | ICD-10-CM | POA: Diagnosis not present

## 2016-11-18 DIAGNOSIS — C8331 Diffuse large B-cell lymphoma, lymph nodes of head, face, and neck: Secondary | ICD-10-CM | POA: Diagnosis not present

## 2016-11-19 ENCOUNTER — Ambulatory Visit (HOSPITAL_COMMUNITY): Payer: Medicare Other | Admitting: Anesthesiology

## 2016-11-19 ENCOUNTER — Ambulatory Visit (HOSPITAL_COMMUNITY)
Admission: RE | Admit: 2016-11-19 | Discharge: 2016-11-19 | Disposition: A | Discharge status: Home / Self Care | Payer: Medicare Other | Source: Ambulatory Visit | Attending: Gastroenterology | Admitting: Gastroenterology

## 2016-11-19 ENCOUNTER — Encounter (HOSPITAL_COMMUNITY): Payer: Self-pay | Admitting: *Deleted

## 2016-11-19 ENCOUNTER — Encounter (HOSPITAL_COMMUNITY)
Admission: RE | Disposition: A | Discharge status: Home / Self Care | Payer: Self-pay | Source: Ambulatory Visit | Attending: Gastroenterology

## 2016-11-19 DIAGNOSIS — M069 Rheumatoid arthritis, unspecified: Secondary | ICD-10-CM | POA: Diagnosis not present

## 2016-11-19 DIAGNOSIS — Z87891 Personal history of nicotine dependence: Secondary | ICD-10-CM | POA: Diagnosis not present

## 2016-11-19 DIAGNOSIS — F419 Anxiety disorder, unspecified: Secondary | ICD-10-CM | POA: Diagnosis not present

## 2016-11-19 DIAGNOSIS — E7800 Pure hypercholesterolemia, unspecified: Secondary | ICD-10-CM | POA: Diagnosis not present

## 2016-11-19 DIAGNOSIS — M1990 Unspecified osteoarthritis, unspecified site: Secondary | ICD-10-CM | POA: Diagnosis not present

## 2016-11-19 DIAGNOSIS — H209 Unspecified iridocyclitis: Secondary | ICD-10-CM | POA: Insufficient documentation

## 2016-11-19 DIAGNOSIS — I341 Nonrheumatic mitral (valve) prolapse: Secondary | ICD-10-CM | POA: Insufficient documentation

## 2016-11-19 DIAGNOSIS — K8689 Other specified diseases of pancreas: Secondary | ICD-10-CM

## 2016-11-19 DIAGNOSIS — R590 Localized enlarged lymph nodes: Secondary | ICD-10-CM | POA: Diagnosis not present

## 2016-11-19 DIAGNOSIS — N814 Uterovaginal prolapse, unspecified: Secondary | ICD-10-CM | POA: Diagnosis not present

## 2016-11-19 DIAGNOSIS — R591 Generalized enlarged lymph nodes: Secondary | ICD-10-CM | POA: Diagnosis not present

## 2016-11-19 DIAGNOSIS — C252 Malignant neoplasm of tail of pancreas: Secondary | ICD-10-CM | POA: Diagnosis not present

## 2016-11-19 DIAGNOSIS — L4050 Arthropathic psoriasis, unspecified: Secondary | ICD-10-CM | POA: Insufficient documentation

## 2016-11-19 DIAGNOSIS — C801 Malignant (primary) neoplasm, unspecified: Secondary | ICD-10-CM | POA: Diagnosis not present

## 2016-11-19 HISTORY — DX: Unspecified osteoarthritis, unspecified site: M19.90

## 2016-11-19 HISTORY — PX: EUS: SHX5427

## 2016-11-19 SURGERY — UPPER ENDOSCOPIC ULTRASOUND (EUS) LINEAR
Anesthesia: Monitor Anesthesia Care

## 2016-11-19 MED ORDER — LIDOCAINE 2% (20 MG/ML) 5 ML SYRINGE
INTRAMUSCULAR | Status: AC
  Filled 2016-11-19: qty 5

## 2016-11-19 MED ORDER — LIDOCAINE 2% (20 MG/ML) 5 ML SYRINGE
INTRAMUSCULAR | Status: DC | PRN
  Administered 2016-11-19: 50 mg via INTRAVENOUS

## 2016-11-19 MED ORDER — PROPOFOL 10 MG/ML IV BOLUS
INTRAVENOUS | Status: DC | PRN
  Administered 2016-11-19 (×3): 20 mg via INTRAVENOUS

## 2016-11-19 MED ORDER — LACTATED RINGERS IV SOLN
INTRAVENOUS | Status: DC
Start: 2016-11-19 — End: 2016-11-19
  Administered 2016-11-19: 12:00:00 via INTRAVENOUS

## 2016-11-19 MED ORDER — SODIUM CHLORIDE 0.9 % IV SOLN: INTRAVENOUS | Status: DC

## 2016-11-19 MED ORDER — PROPOFOL 10 MG/ML IV BOLUS
INTRAVENOUS | Status: AC
  Filled 2016-11-19: qty 60

## 2016-11-19 MED ORDER — PROPOFOL 500 MG/50ML IV EMUL
INTRAVENOUS | Status: DC | PRN
  Administered 2016-11-19: 125 ug/kg/min via INTRAVENOUS

## 2016-11-19 NOTE — Anesthesia Preprocedure Evaluation (Addendum)
Anesthesia Evaluation  Patient identified by MRN, date of birth, ID band Patient awake    Reviewed: Allergy & Precautions, NPO status , Patient's Chart, lab work & pertinent test results  Airway Mallampati: II  TM Distance: >3 FB Neck ROM: Full    Dental no notable dental hx. (+) Teeth Intact, Caps   Pulmonary former smoker,    Pulmonary exam normal breath sounds clear to auscultation       Cardiovascular negative cardio ROS Normal cardiovascular exam+ Valvular Problems/Murmurs MVP  Rhythm:Regular Rate:Normal     Neuro/Psych Anxiety Hx/o Uveitis    GI/Hepatic Pancreatic mass   Endo/Other  Hypercholesterolemia  Renal/GU   negative genitourinary   Musculoskeletal  (+) Arthritis , Psoriatic arthritis   Abdominal   Peds  Hematology Lymphadenopathy- suspected lymphoma Bx results pending   Anesthesia Other Findings   Reproductive/Obstetrics Uterine prolapse                           Anesthesia Physical Anesthesia Plan  ASA: III  Anesthesia Plan: MAC   Post-op Pain Management:    Induction: Intravenous  PONV Risk Score and Plan: 2 and Ondansetron and Propofol  Airway Management Planned: Natural Airway and Nasal Cannula  Additional Equipment:   Intra-op Plan:   Post-operative Plan:   Informed Consent: I have reviewed the patients History and Physical, chart, labs and discussed the procedure including the risks, benefits and alternatives for the proposed anesthesia with the patient or authorized representative who has indicated his/her understanding and acceptance.   Dental advisory given  Plan Discussed with: CRNA, Anesthesiologist and Surgeon  Anesthesia Plan Comments:         Anesthesia Quick Evaluation

## 2016-11-19 NOTE — Transfer of Care (Signed)
Immediate Anesthesia Transfer of Care Note  Patient: Janice Welch  Procedure(s) Performed: Procedure(s): UPPER ENDOSCOPIC ULTRASOUND (EUS) LINEAR (N/A)  Patient Location: PACU and Endoscopy Unit  Anesthesia Type:MAC  Level of Consciousness: sedated and patient cooperative  Airway & Oxygen Therapy: Patient Spontanous Breathing and Patient connected to nasal cannula oxygen  Post-op Assessment: Report given to RN and Post -op Vital signs reviewed and stable  Post vital signs: Reviewed and stable  Last Vitals:  Vitals:   11/19/16 1158  BP: 124/78  Pulse: 68  Resp: (!) 8  Temp: 36.9 C    Last Pain:  Vitals:   11/19/16 1158  TempSrc: Oral         Complications: No apparent anesthesia complications

## 2016-11-19 NOTE — Anesthesia Procedure Notes (Signed)
Procedure Name: MAC Date/Time: 11/19/2016 1:31 PM Performed by: Dione Booze Pre-anesthesia Checklist: Patient identified, Emergency Drugs available, Suction available and Patient being monitored Patient Re-evaluated:Patient Re-evaluated prior to induction Oxygen Delivery Method: Nasal cannula Placement Confirmation: positive ETCO2

## 2016-11-19 NOTE — Op Note (Signed)
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 Providers:                Milus Banister, MD, Cleda Daub, RN, Cletis Athens, Technician, Dione Booze, CRNA Referring MD:             Shon Baton, MD Medicines:                Monitored Anesthesia Care Complications:            No immediate complications. Estimated blood loss:                            None. Estimated Blood Loss:     Estimated blood loss: none. Procedure:                Pre-Anesthesia Assessment:                           - Prior to the procedure, a History and Physical                            was performed, and patient medications and                            allergies were reviewed. The patient's tolerance of                            previous anesthesia was also reviewed. The risks                            and benefits of the procedure and the sedation                            options and risks were discussed with the patient.                            All questions were answered, and informed consent                            was obtained. Prior Anticoagulants: The patient has  taken no previous anticoagulant or antiplatelet                            agents. ASA Grade Assessment: II - A patient with                            mild systemic disease. After reviewing the risks                            and benefits, the patient was deemed in                            satisfactory condition to undergo the procedure.        After obtaining informed consent, the endoscope was                            passed under direct vision. Throughout the                            procedure, the patient's blood pressure, pulse, and                            oxygen saturations were monitored continuously. The                            [id number] ([id number]) scope was introduced through                            the mouth, and advanced to the second part of                            duodenum. The [id number] ([id number]) scope was                            introduced through the mouth, and advanced to the                            second part of duodenum. The upper EUS was                            accomplished without difficulty. The patient                            tolerated the procedure well. Scope In: Scope Out: Findings:      Endoscopic Finding :      The examined esophagus was endoscopically normal.      The entire examined stomach was endoscopically normal.      The examined duodenum was endoscopically normal.      Endosonographic Finding :      1. An irregular mass was identified in the pancreatic tail. The mass was       hypoechoic and heterogenous. The mass measured 25 mm in maximal       cross-sectional diameter. The endosonographic borders were       poorly-defined.  Fine needle aspiration for cytology was performed. The       mass abuts the splenic vessels but does not involve the SMA, SMV, PV or       celiac trunk. Color Doppler imaging was utilized prior to needle       puncture to confirm a lack of significant vascular structures within the       needle path. Three passes were made with the 22 gauge needle using a       transgastric approach. A cytotechnologist was present to evaluate the       adequacy of the specimen.      2. The pancreatic parenchyma was otherwise normal. Impression:               - Irregular appearing, 2.5cm mass in the tail of                            pancreas in  setting of extensive abdominal and                            chest adenopathy. Preliminary cytology reading                            favors adenocarcinoma over lymphoma. Will need to                            await final testing. Moderate Sedation:      N/A- Per Anesthesia Care Recommendation:           - Discharge patient to home. Procedure Code(s):        --- Professional ---                           [phone], Esophagogastroduodenoscopy, flexible,                            transoral; with transendoscopic ultrasound-guided                            intramural or transmural fine needle                            aspiration/biopsy(s), (includes endoscopic                            ultrasound examination limited to the esophagus,                            stomach or duodenum, and adjacent structures) Diagnosis Code(s):        --- Professional ---                           K86.89, Other specified diseases of pancreas                           R59.1, Generalized enlarged lymph nodes CPT copyright 2016 American Medical Association. All rights reserved. The codes documented in this report are preliminary and upon coder review may  be revised to  meet current compliance requirements. Milus Banister, MD 11/19/2016 2:22:44 PM This report has been signed electronically. Number of Addenda: 0

## 2016-11-19 NOTE — Anesthesia Postprocedure Evaluation (Signed)
Anesthesia Post Note  Patient: Janice Welch  Procedure(s) Performed: Procedure(s) (LRB): UPPER ENDOSCOPIC ULTRASOUND (EUS) LINEAR (N/A)     Patient location during evaluation: PACU Anesthesia Type: MAC Level of consciousness: awake and alert and oriented Pain management: pain level controlled Vital Signs Assessment: post-procedure vital signs reviewed and stable Respiratory status: spontaneous breathing, nonlabored ventilation and respiratory function stable Cardiovascular status: stable and blood pressure returned to baseline Postop Assessment: no signs of nausea or vomiting Anesthetic complications: no    Last Vitals:  Vitals:   11/19/16 1158 11/19/16 1423  BP: 124/78 104/61  Pulse: 68 (!) 59  Resp: (!) 8 (!) 21  Temp: 36.9 C 36.5 C    Last Pain:  Vitals:   11/19/16 1423  TempSrc: Oral                 [name] A.

## 2016-11-19 NOTE — Discharge Instructions (Signed)

## 2016-11-19 NOTE — H&P (Signed)
  HPI: This is a very pleasant 71 yo woman  Chief complaint is extensive abdominal, chest adenopathy. Presumed lymphoma, underwent cervical node core biopsy yesterday. Also has PET avid mass in pancreas.  ? Second primary  ROS: complete GI ROS as described in HPI, all other review negative.  Constitutional:  No unintentional weight loss   Past Medical History:  Diagnosis Date  . Abnormal Pap smear of cervix   . Anxiety   . Arthritis    psoriatric  . Colitis   . First degree uterine prolapse 06/2013  . History of anemia   . Hypercholesteremia   . MVP (mitral valve prolapse)   . Psoriatic arthritis ([place])   . Urinary incontinence   . Uveitis   . 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 Vitreous detachment, bilateral     Past Surgical History:  Procedure Laterality Date  . BREAST ENHANCEMENT SURGERY  1979  . BREAST IMPLANT REMOVAL  2010  . CRYOTHERAPY    . TONSILLECTOMY AND ADENOIDECTOMY  1954    Current Facility-Administered Medications  Medication Dose Route Frequency Provider Last Rate Last Dose  . 0.9 %  sodium chloride infusion   Intravenous Continuous Milus Banister, MD      . lactated ringers infusion   Intravenous Continuous Milus Banister, MD        Allergies as of 11/13/2016 - Review Complete 07/14/2016  Allergen Reaction Noted  . Latex  05/30/2013  . Pneumococcal vaccines Swelling 06/13/2015  . Sulfa antibiotics Hives 05/30/2012    Family History  Problem Relation Age of Onset  . Breast cancer Mother   . Osteoporosis Mother   . Hypertension Father   . Aneurysm Father   . Pancreatic cancer Maternal Grandmother   . Diabetes Paternal Grandfather        Type 2    Social History   Social History  . Marital status: Divorced    Spouse name: N/A  . Number of children: N/A  . Years of education: N/A   Occupational History  . Not on file.   Social History Main Topics  . Smoking status: Former Smoker    Quit date: 06/01/2011  . Smokeless tobacco: Never  Used  . Alcohol use No  . Drug use: No  . Sexual activity: No   Other Topics Concern  . Not on file   Social History Narrative  . No narrative on file     Physical Exam: BP 124/78   Pulse 68   Temp 98.5 F (36.9 C) (Oral)   Resp (!) 8   Ht 5\' 5"  (1.651 m)   Wt 128 lb (58.1 kg)   LMP 05/11/1992   SpO2 99%   BMI 21.30 kg/m  Constitutional: generally well-appearing Psychiatric: alert and oriented x3 Abdomen: soft, nontender, nondistended, no obvious ascites, no peritoneal signs, normal bowel sounds No peripheral edema noted in lower extremities  Assessment and plan: 71 y.o. 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 Lymphoma involving pancreas. For upper EUS with FNA today.  Please see the "Patient Instructions" section for addition details about the plan.  Owens Loffler, MD [facility] 11/19/2016, 1:16 PM

## 2016-11-20 ENCOUNTER — Encounter (HOSPITAL_COMMUNITY): Payer: Self-pay | Admitting: Gastroenterology

## 2016-11-24 ENCOUNTER — Encounter: Payer: Self-pay | Admitting: Hematology and Oncology

## 2016-11-25 ENCOUNTER — Ambulatory Visit (HOSPITAL_BASED_OUTPATIENT_CLINIC_OR_DEPARTMENT_OTHER): Payer: Medicare Other | Admitting: Hematology and Oncology

## 2016-11-25 ENCOUNTER — Encounter: Payer: Self-pay | Admitting: Hematology and Oncology

## 2016-11-25 ENCOUNTER — Telehealth: Payer: Self-pay | Admitting: *Deleted

## 2016-11-25 VITALS — BP 134/62 | HR 66 | Temp 98.6°F | Resp 20 | Ht 65.0 in | Wt 129.8 lb

## 2016-11-25 DIAGNOSIS — R109 Unspecified abdominal pain: Secondary | ICD-10-CM | POA: Diagnosis not present

## 2016-11-25 DIAGNOSIS — R5383 Other fatigue: Secondary | ICD-10-CM | POA: Diagnosis not present

## 2016-11-25 DIAGNOSIS — Z800 Family history of malignant neoplasm of digestive organs: Secondary | ICD-10-CM | POA: Diagnosis not present

## 2016-11-25 DIAGNOSIS — R6881 Early satiety: Secondary | ICD-10-CM

## 2016-11-25 DIAGNOSIS — R1310 Dysphagia, unspecified: Secondary | ICD-10-CM

## 2016-11-25 DIAGNOSIS — C8338 Diffuse large B-cell lymphoma, lymph nodes of multiple sites: Secondary | ICD-10-CM | POA: Insufficient documentation

## 2016-11-25 DIAGNOSIS — C259 Malignant neoplasm of pancreas, unspecified: Secondary | ICD-10-CM | POA: Insufficient documentation

## 2016-11-25 DIAGNOSIS — C252 Malignant neoplasm of tail of pancreas: Secondary | ICD-10-CM | POA: Diagnosis not present

## 2016-11-25 NOTE — Patient Instructions (Signed)
Thank you for choosing [facility] to provide your oncology and hematology care.  To afford each patient quality time with our providers, please arrive 30 minutes before your scheduled appointment time.  If you arrive late for your appointment, you may be asked to reschedule.  We strive to give you quality time with our providers, and arriving late affects you and other patients whose appointments are after yours.   If you are a no show for multiple scheduled visits, you may be dismissed from the clinic at the providers discretion.    Again, thank you for choosing [facility], our hope is that these requests will decrease the amount of time that you wait before being seen by our physicians.  ______________________________________________________________________  Should you have questions after your visit to the [facility], please contact our office at [phone] between the hours of 8:30 and 4:30 p.m.    Voicemails left after 4:30p.m will not be returned until the following business day.    For prescription refill requests, please have your pharmacy contact us directly.  Please also try to allow 48 hours for prescription requests.    Please contact the scheduling department for questions regarding scheduling.  For scheduling of procedures such as PET scans, CT scans, MRI, Ultrasound, etc please contact central scheduling at [phone].    Resources For Cancer Patients and Caregivers:   Oncolink.org:  A wonderful resource for patients and healthcare providers for information regarding your disease, ways to tract your treatment, what to expect, etc.     American Cancer Society:  800-227-2345  Can help patients locate various types of support and financial assistance  Cancer Care: 1-800-813-HOPE (4673) Provides financial assistance, online support groups, medication/co-pay assistance.    [place] DSS:  336-641-3447 Where to apply for food  stamps, Medicaid, and utility assistance  Medicare Rights Center: 800-333-4114 Helps people with Medicare understand their rights and benefits, navigate the Medicare system, and secure the quality healthcare they deserve  SCAT: 336-333-6589 [employer] [employer]'s shared-ride transportation service for eligible riders who have a disability that prevents them from riding the fixed route bus.    For additional information on assistance programs please contact our social worker:   Grier Hock/Abigail Elmore:  [phone]            

## 2016-11-25 NOTE — Assessment & Plan Note (Addendum)
Non-germinal center diffuse large B-cell lymphoma, at least stage IIIB in the setting of autoimmune disease and chronic x-ray therapy. Extensive and symptomatic lymphadenopathy with early satiety attributable to the splenic and pancreatic disease as well as retroperitoneal lymphadenopathy. Fatigue, anemia, likely attributable to the progressive disease.  Diffuse large B-cell lymphoma is usually a highly treatable disease with significant chances of curative outcome with aggressive systemic chemoimmunotherapy. Patient's clinical history and performance status do not interfere with her being an excellent candidate for possible treatment. Additional assessment steps prior to initiation of therapy would need to include obtaining a bone marrow biopsy and lumbar puncture assess for possible presence of CNS involvement. Subsequently, Infuse-a-Port could be placed and treatment initiated.   The diagnosis and additional evaluation strategies as well as the availability of the treatments were discussed with the patient in detail today. Patient politely listened to our information, asked very appropriate questions, and ultimately expressed lack of desire for systemic therapy for her malignancy. She did show a very clear understanding of being a candidate for curative-intent therapy, but shewas unwilling to endanger her current quality of life with the potential side effects of the treatments. She felt very comfortable with the idea dying from her malignancy and he was very interested in comfort care in the context of hospice. Based on her lymphoma diagnosis patient has a very limited survival expectancy, clearly below 6 months and most likely measuring several weeks to a couple of months.  She does live alone and independently at this time. Her daughters live out of state, but her ex-husband, who is present during this conversation, tired and willing to assist with her care.  Recommendations: --Consult Hospice -- Dr  Virgina Jock is the patient's PCP and is more than willing to assist with the care in that setting --No additional imaging or lab work as needed based on patient's wishes --Consider discontinuing methotrexate for psoriasis and replace it with Prednisone 20-62m/day for purposes of controlling herbal skin condition, arthritis, improving appetite, and alleviating some of the symptoms of lymphoma, such as early satiety. --RTC with [place] needed  Voice recognition software was used and creation of this note. Despite my best effort at editing the text, some misspelling/errors may have occurred.

## 2016-11-25 NOTE — Progress Notes (Signed)
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  Diffuse large B-cell lymphoma presents more in an imminent threat to patient's well-being and life and would need to be addressed first before addressing the adenocarcinoma of the pancreas. Please see assessment and plan for the appropriate problem for discussion  Diffuse large B-cell lymphoma of lymph nodes of multiple regions ([place]) Non-germinal center diffuse large B-cell lymphoma, at least stage IIIB in the setting of autoimmune disease and chronic x-ray therapy. Extensive and symptomatic lymphadenopathy with early satiety attributable to the splenic and pancreatic disease as well as retroperitoneal lymphadenopathy. Fatigue, anemia, likely attributable to the progressive disease.  Diffuse large B-cell lymphoma is usually a highly treatable disease with significant chances of curative outcome with aggressive systemic chemoimmunotherapy. Patient's clinical history and performance status do not interfere with her being an excellent candidate for possible treatment. Additional assessment steps prior to initiation of therapy would need to include obtaining a bone marrow biopsy and lumbar puncture assess for possible presence of CNS involvement. Subsequently, Infuse-a-Port could be placed and treatment initiated.   The diagnosis and additional evaluation strategies as well as the availability of the treatments were discussed with the patient in detail today. Patient politely listened to our information, asked very appropriate questions, and ultimately expressed lack of desire for systemic therapy for her malignancy. She did show a very clear understanding of being a candidate for curative-intent therapy, but shewas unwilling to endanger her current  quality of life with the potential side effects of the treatments. She felt very comfortable with the idea dying from her malignancy and he was very interested in comfort care in the context of hospice. Based on her lymphoma diagnosis patient has a very limited survival expectancy, clearly below 6 months and most likely measuring several weeks to a couple of months.  She does live alone and independently at this time. Her daughters live out of state, but her ex-husband, who is present during this conversation, tired and willing to assist with her care.  Recommendations: --Consult Hospice -- Dr Virgina Jock is the patient's PCP and is more than willing to assist with the care in that setting --No additional imaging or lab work as needed based on patient's wishes --Consider discontinuing methotrexate for psoriasis and replace it with Prednisone 20-19m/day for purposes of controlling herbal skin condition, arthritis, improving appetite, and alleviating some of the symptoms of lymphoma, such as early satiety. --RTC with [place] needed  Voice recognition software was used and creation of this note. Despite my best effort at editing the text, some misspelling/errors may have occurred.     Orders Placed This Encounter  Procedures  . Ambulatory referral to Hospice    Referral Priority:   Routine    Referral Type:   Consultation    Referral Reason:   Specialty Services Required    Requested Specialty:   Hospice Services    Number of Visits Requested:   1    All questions were answered.  . The patient knows to call the clinic with any problems, questions or concerns.  This note was electronically signed.    History of Presenting Illness [name]. [facility] discussion regarding dual diagnosis of diffuse large B-cell lymphoma and adenocarcinoma of the tail of the pancreas. Her past medical history  is significant for significant psoriasis  including psoriatic arthritis treated with methotrexate, history of colitis. History is significant for answer in mother in her 9s, old grandmother with pancreatic cancer, paternal cousin with pancreatic cancer.  Patient has initially presented to her PCP, Dr Virgina Jock on 11/05/16 with complaints of sharp cramping midline abdominal pain, early satiety or nausea, loss of appetite and 10 pound weight loss over several weeks. She also had some dysphagia and has been unable to swallow bigger pills. At the same time, she developed progressive swelling in the left lower neck. Due to history of gallbladder problems, ultrasound of the abdomen was obtained on 11/06/16 demonstrating 2.9 cm hypoechoic mass in the pancreatic tail, 1.9 cm hypoechoic area in the head of the pancreas, and a 1.2 cm area in the neck of the pancreas with adjacent possible lymphadenopathy. Spleen was normal size, but contained several hypoechoic areas largest measuring 1.6 cm. CT of the abdomen and pelvis were obtained subsequently confirming presence of extensive lymphadenopathy in the retroperitoneum, hepatitis, mesenteric, pelvic, and left inguinal areas. Multiple splenic lesions and an infiltrative lesion in the pancreatic body/tail were discovered. Due to concern for lymphoma, PET CT was obtained confirming presence of extensive hypermetabolic lymphadenopathy in the occipital, cervical, supraclavicular, bilateral axillary, retroperitoneal, pelvic and inguinal regions. Additional masses were discovered in the paraspinal region with potential erosion into the T9 vertebral body, multiple hypermetabolic splenic lesions, and abnormalities in the pancreas. On 07/L/18, patient underwent FNA of the pancreatic tail treating adenocarcinoma consistent with pancreatic primary. On 11/23/16, patient underwent an excisional biopsy of the left cervical lymph node menstruating findings consistent with diffuse large B-cell lymphoma, non-germinal center subtype.   With these findings, patient was referred to our clinic.  Since previous evaluation, patient continues to have persistent symptoms of dysphasia, early satiety, abdominal discomfort. Does have fatigue, trouble at this point in time. No new musculoskeletal or neurological symptoms. No constipation, or diarrhea.  Oncological/hematological History:  Medical History: Past Medical History:  Diagnosis Date  . Abnormal Pap smear of cervix   . Anxiety   . Arthritis    psoriatric  . Colitis   . First degree uterine prolapse 06/2013  . History of anemia   . Hypercholesteremia   . MVP (mitral valve prolapse)   . Psoriatic arthritis ([place])   . Urinary incontinence   . Uveitis   . 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 Vitreous detachment, bilateral     Surgical History: Past Surgical History:  Procedure Laterality Date  . BREAST ENHANCEMENT SURGERY  1979  . BREAST IMPLANT REMOVAL  2010  . CRYOTHERAPY    . EUS N/A 11/19/2016   Procedure: UPPER ENDOSCOPIC ULTRASOUND (EUS) LINEAR;  Surgeon: Milus Banister, MD;  Location: [facility];  Service: Endoscopy;  Laterality: N/A;  . TONSILLECTOMY AND ADENOIDECTOMY  1954    Family History: Family History  Problem Relation Age of Onset  . Breast cancer Mother   . Osteoporosis Mother   . Hypertension Father   . Aneurysm Father   . Pancreatic cancer Maternal Grandmother   . Diabetes Paternal Grandfather        Type 2    Social History: Social History   Social History  . Marital status: Divorced    Spouse name: N/A  . Number of children: N/A  . Years of education: N/A   Occupational History  . Not on file.   Social History Main Topics  . Smoking status: Former Smoker  Quit date: 06/01/2011  . Smokeless tobacco: Never Used  . Alcohol use No  . Drug use: No  . Sexual activity: No   Other Topics Concern  . Not on file   Social History Narrative  . No narrative on file    Allergies: Allergies  Allergen Reactions  . Pneumococcal  Vaccines Swelling  . Sulfa Antibiotics Hives  . Latex Itching and Rash    In long durations     Medications:  Current Outpatient Prescriptions  Medication Sig Dispense Refill  . aspirin EC 325 MG tablet Take 650 mg by mouth daily as needed for mild pain.    Marland Kitchen atorvastatin (LIPITOR) 10 MG tablet Take 10 mg by mouth at bedtime.     . Cholecalciferol (VITAMIN D3) 5000 units CAPS Take 5,000 Units by mouth daily.    . Cyanocobalamin (B-12 SL) Place 5,000 mcg under the tongue 2 (two) times a week.    . ferrous sulfate 325 (65 FE) MG EC tablet Take 325 mg by mouth daily with breakfast.    . FOLIC ACID PO Take 7,425 mcg by mouth daily.     . hydrocortisone cream 1 % Apply 1 application topically daily as needed for itching.    . Melatonin 5 MG CAPS Take 5 mg by mouth at bedtime.     . Multiple Vitamins-Minerals (MULTIVITAMIN PO) Take 1-2 tablets by mouth daily.     . naproxen sodium (ANAPROX) 220 MG tablet Take 220 mg by mouth daily as needed (pain).    . Polyvinyl Alcohol (LUBRICANT DROPS OP) Apply 1 drop to eye 2 (two) times daily as needed (dry eyes).    . sertraline (ZOLOFT) 50 MG tablet Take 50 mg by mouth at bedtime.     . Tetrahydrozoline HCl (VISINE OP) Apply 1 drop to eye daily as needed (redness).    . zinc gluconate 50 MG tablet Take 100 mg by mouth daily.     No current facility-administered medications for this visit.     Review of Systems: Review of Systems  All other systems reviewed and are negative.    PHYSICAL EXAMINATION Blood pressure 134/62, pulse 66, temperature 98.6 F (37 C), temperature source Oral, resp. rate 20, height _0  (1.651 m), weight 129 lb 12.8 oz (58.9 kg), last menstrual period 05/11/1992, SpO2 99 %.  ECOG PERFORMANCE STATUS: 1 - Symptomatic but completely ambulatory  Physical Exam  Physical exam deferred on patient's request  LABORATORY DATA: I have personally reviewed the data as listed: No visits with results within 1 Week(s) from this  visit.  Latest known visit with results is:  Hospital Outpatient Visit on 11/10/2016  Component Date Value Ref Range Status  . Glucose-Capillary 11/10/2016 106* 65 - 99 mg/dL Final       Ardath Sax, MD

## 2016-11-25 NOTE — Telephone Encounter (Signed)
Per Dr. Lebron Conners, hospice referral made.  SW Amber @ [facility] to set up referral.  Informed Dr. Lebron Conners is referring physician, but pt wants Dr. Virgina Jock (PCP) to be attending physician.

## 2016-11-25 NOTE — Assessment & Plan Note (Signed)
Biopsy-confirmed adenocarcinoma of the pancreas of unclear extent as the staging assessment is complicated by concurrent presentation of diffuse large B-cell lymphoma.  Diffuse large B-cell lymphoma presents more in an imminent threat to patient's well-being and life and would need to be addressed first before addressing the adenocarcinoma of the pancreas. Please see assessment and plan for the appropriate problem for discussion

## 2016-11-26 DIAGNOSIS — F339 Major depressive disorder, recurrent, unspecified: Secondary | ICD-10-CM | POA: Diagnosis not present

## 2016-11-26 DIAGNOSIS — C7951 Secondary malignant neoplasm of bone: Secondary | ICD-10-CM | POA: Diagnosis not present

## 2016-11-26 DIAGNOSIS — C259 Malignant neoplasm of pancreas, unspecified: Secondary | ICD-10-CM | POA: Diagnosis not present

## 2016-11-26 DIAGNOSIS — C771 Secondary and unspecified malignant neoplasm of intrathoracic lymph nodes: Secondary | ICD-10-CM | POA: Diagnosis not present

## 2016-11-26 DIAGNOSIS — C8590 Non-Hodgkin lymphoma, unspecified, unspecified site: Secondary | ICD-10-CM | POA: Diagnosis not present

## 2016-11-26 DIAGNOSIS — C772 Secondary and unspecified malignant neoplasm of intra-abdominal lymph nodes: Secondary | ICD-10-CM | POA: Diagnosis not present

## 2016-11-27 DIAGNOSIS — C7951 Secondary malignant neoplasm of bone: Secondary | ICD-10-CM | POA: Diagnosis not present

## 2016-11-27 DIAGNOSIS — C772 Secondary and unspecified malignant neoplasm of intra-abdominal lymph nodes: Secondary | ICD-10-CM | POA: Diagnosis not present

## 2016-11-27 DIAGNOSIS — C8590 Non-Hodgkin lymphoma, unspecified, unspecified site: Secondary | ICD-10-CM | POA: Diagnosis not present

## 2016-11-27 DIAGNOSIS — C771 Secondary and unspecified malignant neoplasm of intrathoracic lymph nodes: Secondary | ICD-10-CM | POA: Diagnosis not present

## 2016-11-27 DIAGNOSIS — C259 Malignant neoplasm of pancreas, unspecified: Secondary | ICD-10-CM | POA: Diagnosis not present

## 2016-11-27 DIAGNOSIS — F339 Major depressive disorder, recurrent, unspecified: Secondary | ICD-10-CM | POA: Diagnosis not present

## 2016-11-30 DIAGNOSIS — C7951 Secondary malignant neoplasm of bone: Secondary | ICD-10-CM | POA: Diagnosis not present

## 2016-11-30 DIAGNOSIS — C772 Secondary and unspecified malignant neoplasm of intra-abdominal lymph nodes: Secondary | ICD-10-CM | POA: Diagnosis not present

## 2016-11-30 DIAGNOSIS — F339 Major depressive disorder, recurrent, unspecified: Secondary | ICD-10-CM | POA: Diagnosis not present

## 2016-11-30 DIAGNOSIS — C259 Malignant neoplasm of pancreas, unspecified: Secondary | ICD-10-CM | POA: Diagnosis not present

## 2016-11-30 DIAGNOSIS — C8590 Non-Hodgkin lymphoma, unspecified, unspecified site: Secondary | ICD-10-CM | POA: Diagnosis not present

## 2016-11-30 DIAGNOSIS — C771 Secondary and unspecified malignant neoplasm of intrathoracic lymph nodes: Secondary | ICD-10-CM | POA: Diagnosis not present

## 2016-12-03 DIAGNOSIS — C771 Secondary and unspecified malignant neoplasm of intrathoracic lymph nodes: Secondary | ICD-10-CM | POA: Diagnosis not present

## 2016-12-03 DIAGNOSIS — F339 Major depressive disorder, recurrent, unspecified: Secondary | ICD-10-CM | POA: Diagnosis not present

## 2016-12-03 DIAGNOSIS — C7951 Secondary malignant neoplasm of bone: Secondary | ICD-10-CM | POA: Diagnosis not present

## 2016-12-03 DIAGNOSIS — C8590 Non-Hodgkin lymphoma, unspecified, unspecified site: Secondary | ICD-10-CM | POA: Diagnosis not present

## 2016-12-03 DIAGNOSIS — C772 Secondary and unspecified malignant neoplasm of intra-abdominal lymph nodes: Secondary | ICD-10-CM | POA: Diagnosis not present

## 2016-12-03 DIAGNOSIS — C259 Malignant neoplasm of pancreas, unspecified: Secondary | ICD-10-CM | POA: Diagnosis not present

## 2016-12-04 DIAGNOSIS — C259 Malignant neoplasm of pancreas, unspecified: Secondary | ICD-10-CM | POA: Diagnosis not present

## 2016-12-04 DIAGNOSIS — C772 Secondary and unspecified malignant neoplasm of intra-abdominal lymph nodes: Secondary | ICD-10-CM | POA: Diagnosis not present

## 2016-12-04 DIAGNOSIS — C8590 Non-Hodgkin lymphoma, unspecified, unspecified site: Secondary | ICD-10-CM | POA: Diagnosis not present

## 2016-12-04 DIAGNOSIS — F339 Major depressive disorder, recurrent, unspecified: Secondary | ICD-10-CM | POA: Diagnosis not present

## 2016-12-04 DIAGNOSIS — C771 Secondary and unspecified malignant neoplasm of intrathoracic lymph nodes: Secondary | ICD-10-CM | POA: Diagnosis not present

## 2016-12-04 DIAGNOSIS — C7951 Secondary malignant neoplasm of bone: Secondary | ICD-10-CM | POA: Diagnosis not present

## 2016-12-09 DIAGNOSIS — C771 Secondary and unspecified malignant neoplasm of intrathoracic lymph nodes: Secondary | ICD-10-CM | POA: Diagnosis not present

## 2016-12-09 DIAGNOSIS — C259 Malignant neoplasm of pancreas, unspecified: Secondary | ICD-10-CM | POA: Diagnosis not present

## 2016-12-09 DIAGNOSIS — C8590 Non-Hodgkin lymphoma, unspecified, unspecified site: Secondary | ICD-10-CM | POA: Diagnosis not present

## 2016-12-09 DIAGNOSIS — C772 Secondary and unspecified malignant neoplasm of intra-abdominal lymph nodes: Secondary | ICD-10-CM | POA: Diagnosis not present

## 2016-12-09 DIAGNOSIS — F339 Major depressive disorder, recurrent, unspecified: Secondary | ICD-10-CM | POA: Diagnosis not present

## 2016-12-09 DIAGNOSIS — C7951 Secondary malignant neoplasm of bone: Secondary | ICD-10-CM | POA: Diagnosis not present

## 2016-12-10 DIAGNOSIS — C771 Secondary and unspecified malignant neoplasm of intrathoracic lymph nodes: Secondary | ICD-10-CM | POA: Diagnosis not present

## 2016-12-10 DIAGNOSIS — F339 Major depressive disorder, recurrent, unspecified: Secondary | ICD-10-CM | POA: Diagnosis not present

## 2016-12-10 DIAGNOSIS — C7951 Secondary malignant neoplasm of bone: Secondary | ICD-10-CM | POA: Diagnosis not present

## 2016-12-10 DIAGNOSIS — C259 Malignant neoplasm of pancreas, unspecified: Secondary | ICD-10-CM | POA: Diagnosis not present

## 2016-12-10 DIAGNOSIS — C8590 Non-Hodgkin lymphoma, unspecified, unspecified site: Secondary | ICD-10-CM | POA: Diagnosis not present

## 2016-12-10 DIAGNOSIS — C772 Secondary and unspecified malignant neoplasm of intra-abdominal lymph nodes: Secondary | ICD-10-CM | POA: Diagnosis not present

## 2016-12-14 ENCOUNTER — Other Ambulatory Visit: Payer: Self-pay | Admitting: Oncology

## 2016-12-14 DIAGNOSIS — R112 Nausea with vomiting, unspecified: Secondary | ICD-10-CM | POA: Diagnosis not present

## 2016-12-14 DIAGNOSIS — Z515 Encounter for palliative care: Secondary | ICD-10-CM | POA: Diagnosis not present

## 2016-12-14 DIAGNOSIS — Z6822 Body mass index (BMI) 22.0-22.9, adult: Secondary | ICD-10-CM | POA: Diagnosis not present

## 2016-12-14 DIAGNOSIS — C258 Malignant neoplasm of overlapping sites of pancreas: Secondary | ICD-10-CM | POA: Diagnosis not present

## 2016-12-14 DIAGNOSIS — R634 Abnormal weight loss: Secondary | ICD-10-CM | POA: Diagnosis not present

## 2016-12-14 DIAGNOSIS — L4050 Arthropathic psoriasis, unspecified: Secondary | ICD-10-CM | POA: Diagnosis not present

## 2016-12-14 DIAGNOSIS — R7301 Impaired fasting glucose: Secondary | ICD-10-CM | POA: Diagnosis not present

## 2016-12-14 DIAGNOSIS — I700 Atherosclerosis of aorta: Secondary | ICD-10-CM | POA: Diagnosis not present

## 2016-12-14 DIAGNOSIS — D8989 Other specified disorders involving the immune mechanism, not elsewhere classified: Secondary | ICD-10-CM | POA: Diagnosis not present

## 2016-12-14 DIAGNOSIS — F3341 Major depressive disorder, recurrent, in partial remission: Secondary | ICD-10-CM | POA: Diagnosis not present

## 2016-12-14 DIAGNOSIS — C8590 Non-Hodgkin lymphoma, unspecified, unspecified site: Secondary | ICD-10-CM | POA: Diagnosis not present

## 2016-12-17 DIAGNOSIS — C259 Malignant neoplasm of pancreas, unspecified: Secondary | ICD-10-CM | POA: Diagnosis not present

## 2016-12-17 DIAGNOSIS — C771 Secondary and unspecified malignant neoplasm of intrathoracic lymph nodes: Secondary | ICD-10-CM | POA: Diagnosis not present

## 2016-12-17 DIAGNOSIS — C7951 Secondary malignant neoplasm of bone: Secondary | ICD-10-CM | POA: Diagnosis not present

## 2016-12-17 DIAGNOSIS — F339 Major depressive disorder, recurrent, unspecified: Secondary | ICD-10-CM | POA: Diagnosis not present

## 2016-12-17 DIAGNOSIS — C8590 Non-Hodgkin lymphoma, unspecified, unspecified site: Secondary | ICD-10-CM | POA: Diagnosis not present

## 2016-12-17 DIAGNOSIS — C772 Secondary and unspecified malignant neoplasm of intra-abdominal lymph nodes: Secondary | ICD-10-CM | POA: Diagnosis not present

## 2016-12-24 DIAGNOSIS — C8590 Non-Hodgkin lymphoma, unspecified, unspecified site: Secondary | ICD-10-CM | POA: Diagnosis not present

## 2016-12-24 DIAGNOSIS — C7951 Secondary malignant neoplasm of bone: Secondary | ICD-10-CM | POA: Diagnosis not present

## 2016-12-24 DIAGNOSIS — F339 Major depressive disorder, recurrent, unspecified: Secondary | ICD-10-CM | POA: Diagnosis not present

## 2016-12-24 DIAGNOSIS — C259 Malignant neoplasm of pancreas, unspecified: Secondary | ICD-10-CM | POA: Diagnosis not present

## 2016-12-24 DIAGNOSIS — C771 Secondary and unspecified malignant neoplasm of intrathoracic lymph nodes: Secondary | ICD-10-CM | POA: Diagnosis not present

## 2016-12-24 DIAGNOSIS — C772 Secondary and unspecified malignant neoplasm of intra-abdominal lymph nodes: Secondary | ICD-10-CM | POA: Diagnosis not present

## 2016-12-25 DIAGNOSIS — C772 Secondary and unspecified malignant neoplasm of intra-abdominal lymph nodes: Secondary | ICD-10-CM | POA: Diagnosis not present

## 2016-12-25 DIAGNOSIS — F339 Major depressive disorder, recurrent, unspecified: Secondary | ICD-10-CM | POA: Diagnosis not present

## 2016-12-25 DIAGNOSIS — C259 Malignant neoplasm of pancreas, unspecified: Secondary | ICD-10-CM | POA: Diagnosis not present

## 2016-12-25 DIAGNOSIS — C771 Secondary and unspecified malignant neoplasm of intrathoracic lymph nodes: Secondary | ICD-10-CM | POA: Diagnosis not present

## 2016-12-25 DIAGNOSIS — C7951 Secondary malignant neoplasm of bone: Secondary | ICD-10-CM | POA: Diagnosis not present

## 2016-12-25 DIAGNOSIS — C8590 Non-Hodgkin lymphoma, unspecified, unspecified site: Secondary | ICD-10-CM | POA: Diagnosis not present

## 2016-12-31 ENCOUNTER — Telehealth: Payer: Self-pay | Admitting: Certified Nurse Midwife

## 2016-12-31 DIAGNOSIS — C7951 Secondary malignant neoplasm of bone: Secondary | ICD-10-CM | POA: Diagnosis not present

## 2016-12-31 DIAGNOSIS — C772 Secondary and unspecified malignant neoplasm of intra-abdominal lymph nodes: Secondary | ICD-10-CM | POA: Diagnosis not present

## 2016-12-31 DIAGNOSIS — C8590 Non-Hodgkin lymphoma, unspecified, unspecified site: Secondary | ICD-10-CM | POA: Diagnosis not present

## 2016-12-31 DIAGNOSIS — C771 Secondary and unspecified malignant neoplasm of intrathoracic lymph nodes: Secondary | ICD-10-CM | POA: Diagnosis not present

## 2016-12-31 DIAGNOSIS — F339 Major depressive disorder, recurrent, unspecified: Secondary | ICD-10-CM | POA: Diagnosis not present

## 2016-12-31 DIAGNOSIS — C259 Malignant neoplasm of pancreas, unspecified: Secondary | ICD-10-CM | POA: Diagnosis not present

## 2016-12-31 NOTE — Telephone Encounter (Signed)
Patient canceled her upcoming aex appointment 07/15/2017. Patient said "I am in hospice care and will not need this appointment".

## 2017-01-07 DIAGNOSIS — C7951 Secondary malignant neoplasm of bone: Secondary | ICD-10-CM | POA: Diagnosis not present

## 2017-01-07 DIAGNOSIS — F339 Major depressive disorder, recurrent, unspecified: Secondary | ICD-10-CM | POA: Diagnosis not present

## 2017-01-07 DIAGNOSIS — C772 Secondary and unspecified malignant neoplasm of intra-abdominal lymph nodes: Secondary | ICD-10-CM | POA: Diagnosis not present

## 2017-01-07 DIAGNOSIS — C259 Malignant neoplasm of pancreas, unspecified: Secondary | ICD-10-CM | POA: Diagnosis not present

## 2017-01-07 DIAGNOSIS — C8590 Non-Hodgkin lymphoma, unspecified, unspecified site: Secondary | ICD-10-CM | POA: Diagnosis not present

## 2017-01-07 DIAGNOSIS — C771 Secondary and unspecified malignant neoplasm of intrathoracic lymph nodes: Secondary | ICD-10-CM | POA: Diagnosis not present

## 2017-01-07 NOTE — Telephone Encounter (Signed)
Okay close encounter?

## 2017-01-07 NOTE — Telephone Encounter (Signed)
Ok to close encounter. 

## 2017-01-08 ENCOUNTER — Telehealth: Payer: Self-pay | Admitting: Certified Nurse Midwife

## 2017-01-09 DIAGNOSIS — C7951 Secondary malignant neoplasm of bone: Secondary | ICD-10-CM | POA: Diagnosis not present

## 2017-01-09 DIAGNOSIS — C8590 Non-Hodgkin lymphoma, unspecified, unspecified site: Secondary | ICD-10-CM | POA: Diagnosis not present

## 2017-01-09 DIAGNOSIS — C259 Malignant neoplasm of pancreas, unspecified: Secondary | ICD-10-CM | POA: Diagnosis not present

## 2017-01-09 DIAGNOSIS — C771 Secondary and unspecified malignant neoplasm of intrathoracic lymph nodes: Secondary | ICD-10-CM | POA: Diagnosis not present

## 2017-01-09 DIAGNOSIS — C772 Secondary and unspecified malignant neoplasm of intra-abdominal lymph nodes: Secondary | ICD-10-CM | POA: Diagnosis not present

## 2017-01-09 DIAGNOSIS — F339 Major depressive disorder, recurrent, unspecified: Secondary | ICD-10-CM | POA: Diagnosis not present

## 2017-01-12 NOTE — Telephone Encounter (Signed)
Erroneous encounter

## 2017-01-13 DIAGNOSIS — C8590 Non-Hodgkin lymphoma, unspecified, unspecified site: Secondary | ICD-10-CM | POA: Diagnosis not present

## 2017-01-13 DIAGNOSIS — F339 Major depressive disorder, recurrent, unspecified: Secondary | ICD-10-CM | POA: Diagnosis not present

## 2017-01-13 DIAGNOSIS — C7951 Secondary malignant neoplasm of bone: Secondary | ICD-10-CM | POA: Diagnosis not present

## 2017-01-13 DIAGNOSIS — C259 Malignant neoplasm of pancreas, unspecified: Secondary | ICD-10-CM | POA: Diagnosis not present

## 2017-01-13 DIAGNOSIS — C772 Secondary and unspecified malignant neoplasm of intra-abdominal lymph nodes: Secondary | ICD-10-CM | POA: Diagnosis not present

## 2017-01-13 DIAGNOSIS — C771 Secondary and unspecified malignant neoplasm of intrathoracic lymph nodes: Secondary | ICD-10-CM | POA: Diagnosis not present

## 2017-01-14 DIAGNOSIS — F339 Major depressive disorder, recurrent, unspecified: Secondary | ICD-10-CM | POA: Diagnosis not present

## 2017-01-14 DIAGNOSIS — C8590 Non-Hodgkin lymphoma, unspecified, unspecified site: Secondary | ICD-10-CM | POA: Diagnosis not present

## 2017-01-14 DIAGNOSIS — C259 Malignant neoplasm of pancreas, unspecified: Secondary | ICD-10-CM | POA: Diagnosis not present

## 2017-01-14 DIAGNOSIS — C772 Secondary and unspecified malignant neoplasm of intra-abdominal lymph nodes: Secondary | ICD-10-CM | POA: Diagnosis not present

## 2017-01-14 DIAGNOSIS — C7951 Secondary malignant neoplasm of bone: Secondary | ICD-10-CM | POA: Diagnosis not present

## 2017-01-14 DIAGNOSIS — C771 Secondary and unspecified malignant neoplasm of intrathoracic lymph nodes: Secondary | ICD-10-CM | POA: Diagnosis not present

## 2017-01-21 DIAGNOSIS — F339 Major depressive disorder, recurrent, unspecified: Secondary | ICD-10-CM | POA: Diagnosis not present

## 2017-01-21 DIAGNOSIS — C772 Secondary and unspecified malignant neoplasm of intra-abdominal lymph nodes: Secondary | ICD-10-CM | POA: Diagnosis not present

## 2017-01-21 DIAGNOSIS — C771 Secondary and unspecified malignant neoplasm of intrathoracic lymph nodes: Secondary | ICD-10-CM | POA: Diagnosis not present

## 2017-01-21 DIAGNOSIS — C7951 Secondary malignant neoplasm of bone: Secondary | ICD-10-CM | POA: Diagnosis not present

## 2017-01-21 DIAGNOSIS — C259 Malignant neoplasm of pancreas, unspecified: Secondary | ICD-10-CM | POA: Diagnosis not present

## 2017-01-21 DIAGNOSIS — C8590 Non-Hodgkin lymphoma, unspecified, unspecified site: Secondary | ICD-10-CM | POA: Diagnosis not present

## 2017-01-27 DIAGNOSIS — C771 Secondary and unspecified malignant neoplasm of intrathoracic lymph nodes: Secondary | ICD-10-CM | POA: Diagnosis not present

## 2017-01-27 DIAGNOSIS — F339 Major depressive disorder, recurrent, unspecified: Secondary | ICD-10-CM | POA: Diagnosis not present

## 2017-01-27 DIAGNOSIS — C8590 Non-Hodgkin lymphoma, unspecified, unspecified site: Secondary | ICD-10-CM | POA: Diagnosis not present

## 2017-01-27 DIAGNOSIS — C772 Secondary and unspecified malignant neoplasm of intra-abdominal lymph nodes: Secondary | ICD-10-CM | POA: Diagnosis not present

## 2017-01-27 DIAGNOSIS — C259 Malignant neoplasm of pancreas, unspecified: Secondary | ICD-10-CM | POA: Diagnosis not present

## 2017-01-27 DIAGNOSIS — C7951 Secondary malignant neoplasm of bone: Secondary | ICD-10-CM | POA: Diagnosis not present

## 2017-01-28 DIAGNOSIS — Z23 Encounter for immunization: Secondary | ICD-10-CM | POA: Diagnosis not present

## 2017-01-28 DIAGNOSIS — C7951 Secondary malignant neoplasm of bone: Secondary | ICD-10-CM | POA: Diagnosis not present

## 2017-01-28 DIAGNOSIS — C772 Secondary and unspecified malignant neoplasm of intra-abdominal lymph nodes: Secondary | ICD-10-CM | POA: Diagnosis not present

## 2017-01-28 DIAGNOSIS — C8590 Non-Hodgkin lymphoma, unspecified, unspecified site: Secondary | ICD-10-CM | POA: Diagnosis not present

## 2017-01-28 DIAGNOSIS — C259 Malignant neoplasm of pancreas, unspecified: Secondary | ICD-10-CM | POA: Diagnosis not present

## 2017-01-28 DIAGNOSIS — F339 Major depressive disorder, recurrent, unspecified: Secondary | ICD-10-CM | POA: Diagnosis not present

## 2017-01-28 DIAGNOSIS — C771 Secondary and unspecified malignant neoplasm of intrathoracic lymph nodes: Secondary | ICD-10-CM | POA: Diagnosis not present

## 2017-02-04 DIAGNOSIS — C7951 Secondary malignant neoplasm of bone: Secondary | ICD-10-CM | POA: Diagnosis not present

## 2017-02-04 DIAGNOSIS — F339 Major depressive disorder, recurrent, unspecified: Secondary | ICD-10-CM | POA: Diagnosis not present

## 2017-02-04 DIAGNOSIS — C771 Secondary and unspecified malignant neoplasm of intrathoracic lymph nodes: Secondary | ICD-10-CM | POA: Diagnosis not present

## 2017-02-04 DIAGNOSIS — C772 Secondary and unspecified malignant neoplasm of intra-abdominal lymph nodes: Secondary | ICD-10-CM | POA: Diagnosis not present

## 2017-02-04 DIAGNOSIS — C259 Malignant neoplasm of pancreas, unspecified: Secondary | ICD-10-CM | POA: Diagnosis not present

## 2017-02-04 DIAGNOSIS — C8590 Non-Hodgkin lymphoma, unspecified, unspecified site: Secondary | ICD-10-CM | POA: Diagnosis not present

## 2017-02-08 DIAGNOSIS — C7951 Secondary malignant neoplasm of bone: Secondary | ICD-10-CM | POA: Diagnosis not present

## 2017-02-08 DIAGNOSIS — C772 Secondary and unspecified malignant neoplasm of intra-abdominal lymph nodes: Secondary | ICD-10-CM | POA: Diagnosis not present

## 2017-02-08 DIAGNOSIS — C8590 Non-Hodgkin lymphoma, unspecified, unspecified site: Secondary | ICD-10-CM | POA: Diagnosis not present

## 2017-02-08 DIAGNOSIS — C771 Secondary and unspecified malignant neoplasm of intrathoracic lymph nodes: Secondary | ICD-10-CM | POA: Diagnosis not present

## 2017-02-08 DIAGNOSIS — F339 Major depressive disorder, recurrent, unspecified: Secondary | ICD-10-CM | POA: Diagnosis not present

## 2017-02-08 DIAGNOSIS — C259 Malignant neoplasm of pancreas, unspecified: Secondary | ICD-10-CM | POA: Diagnosis not present

## 2017-02-09 DIAGNOSIS — F339 Major depressive disorder, recurrent, unspecified: Secondary | ICD-10-CM | POA: Diagnosis not present

## 2017-02-09 DIAGNOSIS — C772 Secondary and unspecified malignant neoplasm of intra-abdominal lymph nodes: Secondary | ICD-10-CM | POA: Diagnosis not present

## 2017-02-09 DIAGNOSIS — C7951 Secondary malignant neoplasm of bone: Secondary | ICD-10-CM | POA: Diagnosis not present

## 2017-02-09 DIAGNOSIS — C771 Secondary and unspecified malignant neoplasm of intrathoracic lymph nodes: Secondary | ICD-10-CM | POA: Diagnosis not present

## 2017-02-09 DIAGNOSIS — C259 Malignant neoplasm of pancreas, unspecified: Secondary | ICD-10-CM | POA: Diagnosis not present

## 2017-02-09 DIAGNOSIS — C8590 Non-Hodgkin lymphoma, unspecified, unspecified site: Secondary | ICD-10-CM | POA: Diagnosis not present

## 2017-02-12 DIAGNOSIS — C772 Secondary and unspecified malignant neoplasm of intra-abdominal lymph nodes: Secondary | ICD-10-CM | POA: Diagnosis not present

## 2017-02-12 DIAGNOSIS — F339 Major depressive disorder, recurrent, unspecified: Secondary | ICD-10-CM | POA: Diagnosis not present

## 2017-02-12 DIAGNOSIS — C8590 Non-Hodgkin lymphoma, unspecified, unspecified site: Secondary | ICD-10-CM | POA: Diagnosis not present

## 2017-02-12 DIAGNOSIS — C771 Secondary and unspecified malignant neoplasm of intrathoracic lymph nodes: Secondary | ICD-10-CM | POA: Diagnosis not present

## 2017-02-12 DIAGNOSIS — C259 Malignant neoplasm of pancreas, unspecified: Secondary | ICD-10-CM | POA: Diagnosis not present

## 2017-02-12 DIAGNOSIS — C7951 Secondary malignant neoplasm of bone: Secondary | ICD-10-CM | POA: Diagnosis not present

## 2017-02-18 DIAGNOSIS — C259 Malignant neoplasm of pancreas, unspecified: Secondary | ICD-10-CM | POA: Diagnosis not present

## 2017-02-18 DIAGNOSIS — C771 Secondary and unspecified malignant neoplasm of intrathoracic lymph nodes: Secondary | ICD-10-CM | POA: Diagnosis not present

## 2017-02-18 DIAGNOSIS — C7951 Secondary malignant neoplasm of bone: Secondary | ICD-10-CM | POA: Diagnosis not present

## 2017-02-18 DIAGNOSIS — F339 Major depressive disorder, recurrent, unspecified: Secondary | ICD-10-CM | POA: Diagnosis not present

## 2017-02-18 DIAGNOSIS — C772 Secondary and unspecified malignant neoplasm of intra-abdominal lymph nodes: Secondary | ICD-10-CM | POA: Diagnosis not present

## 2017-02-18 DIAGNOSIS — C8590 Non-Hodgkin lymphoma, unspecified, unspecified site: Secondary | ICD-10-CM | POA: Diagnosis not present

## 2017-02-25 DIAGNOSIS — F339 Major depressive disorder, recurrent, unspecified: Secondary | ICD-10-CM | POA: Diagnosis not present

## 2017-02-25 DIAGNOSIS — C259 Malignant neoplasm of pancreas, unspecified: Secondary | ICD-10-CM | POA: Diagnosis not present

## 2017-02-25 DIAGNOSIS — C8590 Non-Hodgkin lymphoma, unspecified, unspecified site: Secondary | ICD-10-CM | POA: Diagnosis not present

## 2017-02-25 DIAGNOSIS — C7951 Secondary malignant neoplasm of bone: Secondary | ICD-10-CM | POA: Diagnosis not present

## 2017-02-25 DIAGNOSIS — C772 Secondary and unspecified malignant neoplasm of intra-abdominal lymph nodes: Secondary | ICD-10-CM | POA: Diagnosis not present

## 2017-02-25 DIAGNOSIS — C771 Secondary and unspecified malignant neoplasm of intrathoracic lymph nodes: Secondary | ICD-10-CM | POA: Diagnosis not present

## 2017-03-04 DIAGNOSIS — C8590 Non-Hodgkin lymphoma, unspecified, unspecified site: Secondary | ICD-10-CM | POA: Diagnosis not present

## 2017-03-04 DIAGNOSIS — C259 Malignant neoplasm of pancreas, unspecified: Secondary | ICD-10-CM | POA: Diagnosis not present

## 2017-03-04 DIAGNOSIS — F339 Major depressive disorder, recurrent, unspecified: Secondary | ICD-10-CM | POA: Diagnosis not present

## 2017-03-04 DIAGNOSIS — C7951 Secondary malignant neoplasm of bone: Secondary | ICD-10-CM | POA: Diagnosis not present

## 2017-03-04 DIAGNOSIS — C772 Secondary and unspecified malignant neoplasm of intra-abdominal lymph nodes: Secondary | ICD-10-CM | POA: Diagnosis not present

## 2017-03-04 DIAGNOSIS — C771 Secondary and unspecified malignant neoplasm of intrathoracic lymph nodes: Secondary | ICD-10-CM | POA: Diagnosis not present

## 2017-03-11 DIAGNOSIS — C772 Secondary and unspecified malignant neoplasm of intra-abdominal lymph nodes: Secondary | ICD-10-CM | POA: Diagnosis not present

## 2017-03-11 DIAGNOSIS — C771 Secondary and unspecified malignant neoplasm of intrathoracic lymph nodes: Secondary | ICD-10-CM | POA: Diagnosis not present

## 2017-03-11 DIAGNOSIS — C7951 Secondary malignant neoplasm of bone: Secondary | ICD-10-CM | POA: Diagnosis not present

## 2017-03-11 DIAGNOSIS — C259 Malignant neoplasm of pancreas, unspecified: Secondary | ICD-10-CM | POA: Diagnosis not present

## 2017-03-11 DIAGNOSIS — L4052 Psoriatic arthritis mutilans: Secondary | ICD-10-CM | POA: Diagnosis not present

## 2017-03-11 DIAGNOSIS — F339 Major depressive disorder, recurrent, unspecified: Secondary | ICD-10-CM | POA: Diagnosis not present

## 2017-03-11 DIAGNOSIS — H44139 Sympathetic uveitis, unspecified eye: Secondary | ICD-10-CM | POA: Diagnosis not present

## 2017-03-11 DIAGNOSIS — C8590 Non-Hodgkin lymphoma, unspecified, unspecified site: Secondary | ICD-10-CM | POA: Diagnosis not present

## 2017-03-16 ENCOUNTER — Telehealth: Payer: Self-pay | Admitting: *Deleted

## 2017-03-16 NOTE — Telephone Encounter (Signed)
Patient in 04 recall. Patient currently in hospice care for pancreatic cancer. Will completed recall -eh

## 2017-03-18 DIAGNOSIS — C771 Secondary and unspecified malignant neoplasm of intrathoracic lymph nodes: Secondary | ICD-10-CM | POA: Diagnosis not present

## 2017-03-18 DIAGNOSIS — C772 Secondary and unspecified malignant neoplasm of intra-abdominal lymph nodes: Secondary | ICD-10-CM | POA: Diagnosis not present

## 2017-03-18 DIAGNOSIS — C8590 Non-Hodgkin lymphoma, unspecified, unspecified site: Secondary | ICD-10-CM | POA: Diagnosis not present

## 2017-03-18 DIAGNOSIS — C7951 Secondary malignant neoplasm of bone: Secondary | ICD-10-CM | POA: Diagnosis not present

## 2017-03-18 DIAGNOSIS — F339 Major depressive disorder, recurrent, unspecified: Secondary | ICD-10-CM | POA: Diagnosis not present

## 2017-03-18 DIAGNOSIS — C259 Malignant neoplasm of pancreas, unspecified: Secondary | ICD-10-CM | POA: Diagnosis not present

## 2017-03-22 DIAGNOSIS — C8590 Non-Hodgkin lymphoma, unspecified, unspecified site: Secondary | ICD-10-CM | POA: Diagnosis not present

## 2017-03-22 DIAGNOSIS — C258 Malignant neoplasm of overlapping sites of pancreas: Secondary | ICD-10-CM | POA: Diagnosis not present

## 2017-03-22 DIAGNOSIS — Z515 Encounter for palliative care: Secondary | ICD-10-CM | POA: Diagnosis not present

## 2017-03-22 DIAGNOSIS — Z6824 Body mass index (BMI) 24.0-24.9, adult: Secondary | ICD-10-CM | POA: Diagnosis not present

## 2017-03-22 DIAGNOSIS — F3341 Major depressive disorder, recurrent, in partial remission: Secondary | ICD-10-CM | POA: Diagnosis not present

## 2017-03-26 DIAGNOSIS — C772 Secondary and unspecified malignant neoplasm of intra-abdominal lymph nodes: Secondary | ICD-10-CM | POA: Diagnosis not present

## 2017-03-26 DIAGNOSIS — C7951 Secondary malignant neoplasm of bone: Secondary | ICD-10-CM | POA: Diagnosis not present

## 2017-03-26 DIAGNOSIS — C259 Malignant neoplasm of pancreas, unspecified: Secondary | ICD-10-CM | POA: Diagnosis not present

## 2017-03-26 DIAGNOSIS — F339 Major depressive disorder, recurrent, unspecified: Secondary | ICD-10-CM | POA: Diagnosis not present

## 2017-03-26 DIAGNOSIS — C771 Secondary and unspecified malignant neoplasm of intrathoracic lymph nodes: Secondary | ICD-10-CM | POA: Diagnosis not present

## 2017-03-26 DIAGNOSIS — C8590 Non-Hodgkin lymphoma, unspecified, unspecified site: Secondary | ICD-10-CM | POA: Diagnosis not present

## 2017-04-02 DIAGNOSIS — C771 Secondary and unspecified malignant neoplasm of intrathoracic lymph nodes: Secondary | ICD-10-CM | POA: Diagnosis not present

## 2017-04-02 DIAGNOSIS — C772 Secondary and unspecified malignant neoplasm of intra-abdominal lymph nodes: Secondary | ICD-10-CM | POA: Diagnosis not present

## 2017-04-02 DIAGNOSIS — C259 Malignant neoplasm of pancreas, unspecified: Secondary | ICD-10-CM | POA: Diagnosis not present

## 2017-04-02 DIAGNOSIS — C7951 Secondary malignant neoplasm of bone: Secondary | ICD-10-CM | POA: Diagnosis not present

## 2017-04-02 DIAGNOSIS — F339 Major depressive disorder, recurrent, unspecified: Secondary | ICD-10-CM | POA: Diagnosis not present

## 2017-04-02 DIAGNOSIS — C8590 Non-Hodgkin lymphoma, unspecified, unspecified site: Secondary | ICD-10-CM | POA: Diagnosis not present

## 2017-04-08 DIAGNOSIS — C772 Secondary and unspecified malignant neoplasm of intra-abdominal lymph nodes: Secondary | ICD-10-CM | POA: Diagnosis not present

## 2017-04-08 DIAGNOSIS — C8590 Non-Hodgkin lymphoma, unspecified, unspecified site: Secondary | ICD-10-CM | POA: Diagnosis not present

## 2017-04-08 DIAGNOSIS — F339 Major depressive disorder, recurrent, unspecified: Secondary | ICD-10-CM | POA: Diagnosis not present

## 2017-04-08 DIAGNOSIS — C259 Malignant neoplasm of pancreas, unspecified: Secondary | ICD-10-CM | POA: Diagnosis not present

## 2017-04-08 DIAGNOSIS — C7951 Secondary malignant neoplasm of bone: Secondary | ICD-10-CM | POA: Diagnosis not present

## 2017-04-08 DIAGNOSIS — C771 Secondary and unspecified malignant neoplasm of intrathoracic lymph nodes: Secondary | ICD-10-CM | POA: Diagnosis not present

## 2017-04-10 DIAGNOSIS — H44139 Sympathetic uveitis, unspecified eye: Secondary | ICD-10-CM | POA: Diagnosis not present

## 2017-04-10 DIAGNOSIS — F339 Major depressive disorder, recurrent, unspecified: Secondary | ICD-10-CM | POA: Diagnosis not present

## 2017-04-10 DIAGNOSIS — C8590 Non-Hodgkin lymphoma, unspecified, unspecified site: Secondary | ICD-10-CM | POA: Diagnosis not present

## 2017-04-10 DIAGNOSIS — C772 Secondary and unspecified malignant neoplasm of intra-abdominal lymph nodes: Secondary | ICD-10-CM | POA: Diagnosis not present

## 2017-04-10 DIAGNOSIS — C771 Secondary and unspecified malignant neoplasm of intrathoracic lymph nodes: Secondary | ICD-10-CM | POA: Diagnosis not present

## 2017-04-10 DIAGNOSIS — L4052 Psoriatic arthritis mutilans: Secondary | ICD-10-CM | POA: Diagnosis not present

## 2017-04-10 DIAGNOSIS — C259 Malignant neoplasm of pancreas, unspecified: Secondary | ICD-10-CM | POA: Diagnosis not present

## 2017-04-10 DIAGNOSIS — C7951 Secondary malignant neoplasm of bone: Secondary | ICD-10-CM | POA: Diagnosis not present

## 2017-04-15 DIAGNOSIS — C771 Secondary and unspecified malignant neoplasm of intrathoracic lymph nodes: Secondary | ICD-10-CM | POA: Diagnosis not present

## 2017-04-15 DIAGNOSIS — C772 Secondary and unspecified malignant neoplasm of intra-abdominal lymph nodes: Secondary | ICD-10-CM | POA: Diagnosis not present

## 2017-04-15 DIAGNOSIS — C259 Malignant neoplasm of pancreas, unspecified: Secondary | ICD-10-CM | POA: Diagnosis not present

## 2017-04-15 DIAGNOSIS — C7951 Secondary malignant neoplasm of bone: Secondary | ICD-10-CM | POA: Diagnosis not present

## 2017-04-15 DIAGNOSIS — F339 Major depressive disorder, recurrent, unspecified: Secondary | ICD-10-CM | POA: Diagnosis not present

## 2017-04-15 DIAGNOSIS — C8590 Non-Hodgkin lymphoma, unspecified, unspecified site: Secondary | ICD-10-CM | POA: Diagnosis not present

## 2017-04-23 DIAGNOSIS — F339 Major depressive disorder, recurrent, unspecified: Secondary | ICD-10-CM | POA: Diagnosis not present

## 2017-04-23 DIAGNOSIS — C772 Secondary and unspecified malignant neoplasm of intra-abdominal lymph nodes: Secondary | ICD-10-CM | POA: Diagnosis not present

## 2017-04-23 DIAGNOSIS — C8590 Non-Hodgkin lymphoma, unspecified, unspecified site: Secondary | ICD-10-CM | POA: Diagnosis not present

## 2017-04-23 DIAGNOSIS — C7951 Secondary malignant neoplasm of bone: Secondary | ICD-10-CM | POA: Diagnosis not present

## 2017-04-23 DIAGNOSIS — C259 Malignant neoplasm of pancreas, unspecified: Secondary | ICD-10-CM | POA: Diagnosis not present

## 2017-04-23 DIAGNOSIS — C771 Secondary and unspecified malignant neoplasm of intrathoracic lymph nodes: Secondary | ICD-10-CM | POA: Diagnosis not present

## 2017-04-30 DIAGNOSIS — C772 Secondary and unspecified malignant neoplasm of intra-abdominal lymph nodes: Secondary | ICD-10-CM | POA: Diagnosis not present

## 2017-04-30 DIAGNOSIS — C8590 Non-Hodgkin lymphoma, unspecified, unspecified site: Secondary | ICD-10-CM | POA: Diagnosis not present

## 2017-04-30 DIAGNOSIS — C771 Secondary and unspecified malignant neoplasm of intrathoracic lymph nodes: Secondary | ICD-10-CM | POA: Diagnosis not present

## 2017-04-30 DIAGNOSIS — C259 Malignant neoplasm of pancreas, unspecified: Secondary | ICD-10-CM | POA: Diagnosis not present

## 2017-04-30 DIAGNOSIS — C7951 Secondary malignant neoplasm of bone: Secondary | ICD-10-CM | POA: Diagnosis not present

## 2017-04-30 DIAGNOSIS — F339 Major depressive disorder, recurrent, unspecified: Secondary | ICD-10-CM | POA: Diagnosis not present

## 2017-05-07 DIAGNOSIS — C259 Malignant neoplasm of pancreas, unspecified: Secondary | ICD-10-CM | POA: Diagnosis not present

## 2017-05-07 DIAGNOSIS — C771 Secondary and unspecified malignant neoplasm of intrathoracic lymph nodes: Secondary | ICD-10-CM | POA: Diagnosis not present

## 2017-05-07 DIAGNOSIS — F339 Major depressive disorder, recurrent, unspecified: Secondary | ICD-10-CM | POA: Diagnosis not present

## 2017-05-07 DIAGNOSIS — C772 Secondary and unspecified malignant neoplasm of intra-abdominal lymph nodes: Secondary | ICD-10-CM | POA: Diagnosis not present

## 2017-05-07 DIAGNOSIS — C7951 Secondary malignant neoplasm of bone: Secondary | ICD-10-CM | POA: Diagnosis not present

## 2017-05-07 DIAGNOSIS — C8590 Non-Hodgkin lymphoma, unspecified, unspecified site: Secondary | ICD-10-CM | POA: Diagnosis not present

## 2017-05-11 DIAGNOSIS — F339 Major depressive disorder, recurrent, unspecified: Secondary | ICD-10-CM | POA: Diagnosis not present

## 2017-05-11 DIAGNOSIS — C8590 Non-Hodgkin lymphoma, unspecified, unspecified site: Secondary | ICD-10-CM | POA: Diagnosis not present

## 2017-05-11 DIAGNOSIS — C7951 Secondary malignant neoplasm of bone: Secondary | ICD-10-CM | POA: Diagnosis not present

## 2017-05-11 DIAGNOSIS — H44139 Sympathetic uveitis, unspecified eye: Secondary | ICD-10-CM | POA: Diagnosis not present

## 2017-05-11 DIAGNOSIS — L4052 Psoriatic arthritis mutilans: Secondary | ICD-10-CM | POA: Diagnosis not present

## 2017-05-11 DIAGNOSIS — C771 Secondary and unspecified malignant neoplasm of intrathoracic lymph nodes: Secondary | ICD-10-CM | POA: Diagnosis not present

## 2017-05-11 DIAGNOSIS — C259 Malignant neoplasm of pancreas, unspecified: Secondary | ICD-10-CM | POA: Diagnosis not present

## 2017-05-11 DIAGNOSIS — C772 Secondary and unspecified malignant neoplasm of intra-abdominal lymph nodes: Secondary | ICD-10-CM | POA: Diagnosis not present

## 2017-05-14 DIAGNOSIS — C8590 Non-Hodgkin lymphoma, unspecified, unspecified site: Secondary | ICD-10-CM | POA: Diagnosis not present

## 2017-05-14 DIAGNOSIS — C259 Malignant neoplasm of pancreas, unspecified: Secondary | ICD-10-CM | POA: Diagnosis not present

## 2017-05-14 DIAGNOSIS — C772 Secondary and unspecified malignant neoplasm of intra-abdominal lymph nodes: Secondary | ICD-10-CM | POA: Diagnosis not present

## 2017-05-14 DIAGNOSIS — C771 Secondary and unspecified malignant neoplasm of intrathoracic lymph nodes: Secondary | ICD-10-CM | POA: Diagnosis not present

## 2017-05-14 DIAGNOSIS — F339 Major depressive disorder, recurrent, unspecified: Secondary | ICD-10-CM | POA: Diagnosis not present

## 2017-05-14 DIAGNOSIS — C7951 Secondary malignant neoplasm of bone: Secondary | ICD-10-CM | POA: Diagnosis not present

## 2017-05-17 DIAGNOSIS — C771 Secondary and unspecified malignant neoplasm of intrathoracic lymph nodes: Secondary | ICD-10-CM | POA: Diagnosis not present

## 2017-05-17 DIAGNOSIS — C8590 Non-Hodgkin lymphoma, unspecified, unspecified site: Secondary | ICD-10-CM | POA: Diagnosis not present

## 2017-05-17 DIAGNOSIS — C772 Secondary and unspecified malignant neoplasm of intra-abdominal lymph nodes: Secondary | ICD-10-CM | POA: Diagnosis not present

## 2017-05-17 DIAGNOSIS — C7951 Secondary malignant neoplasm of bone: Secondary | ICD-10-CM | POA: Diagnosis not present

## 2017-05-17 DIAGNOSIS — C259 Malignant neoplasm of pancreas, unspecified: Secondary | ICD-10-CM | POA: Diagnosis not present

## 2017-05-17 DIAGNOSIS — F339 Major depressive disorder, recurrent, unspecified: Secondary | ICD-10-CM | POA: Diagnosis not present

## 2017-05-19 DIAGNOSIS — C7951 Secondary malignant neoplasm of bone: Secondary | ICD-10-CM | POA: Diagnosis not present

## 2017-05-19 DIAGNOSIS — C772 Secondary and unspecified malignant neoplasm of intra-abdominal lymph nodes: Secondary | ICD-10-CM | POA: Diagnosis not present

## 2017-05-19 DIAGNOSIS — C771 Secondary and unspecified malignant neoplasm of intrathoracic lymph nodes: Secondary | ICD-10-CM | POA: Diagnosis not present

## 2017-05-19 DIAGNOSIS — C8590 Non-Hodgkin lymphoma, unspecified, unspecified site: Secondary | ICD-10-CM | POA: Diagnosis not present

## 2017-05-19 DIAGNOSIS — C259 Malignant neoplasm of pancreas, unspecified: Secondary | ICD-10-CM | POA: Diagnosis not present

## 2017-05-19 DIAGNOSIS — F339 Major depressive disorder, recurrent, unspecified: Secondary | ICD-10-CM | POA: Diagnosis not present

## 2017-05-21 DIAGNOSIS — F339 Major depressive disorder, recurrent, unspecified: Secondary | ICD-10-CM | POA: Diagnosis not present

## 2017-05-21 DIAGNOSIS — C771 Secondary and unspecified malignant neoplasm of intrathoracic lymph nodes: Secondary | ICD-10-CM | POA: Diagnosis not present

## 2017-05-21 DIAGNOSIS — C772 Secondary and unspecified malignant neoplasm of intra-abdominal lymph nodes: Secondary | ICD-10-CM | POA: Diagnosis not present

## 2017-05-21 DIAGNOSIS — C8590 Non-Hodgkin lymphoma, unspecified, unspecified site: Secondary | ICD-10-CM | POA: Diagnosis not present

## 2017-05-21 DIAGNOSIS — C7951 Secondary malignant neoplasm of bone: Secondary | ICD-10-CM | POA: Diagnosis not present

## 2017-05-21 DIAGNOSIS — C259 Malignant neoplasm of pancreas, unspecified: Secondary | ICD-10-CM | POA: Diagnosis not present

## 2017-05-24 DIAGNOSIS — C7951 Secondary malignant neoplasm of bone: Secondary | ICD-10-CM | POA: Diagnosis not present

## 2017-05-24 DIAGNOSIS — C771 Secondary and unspecified malignant neoplasm of intrathoracic lymph nodes: Secondary | ICD-10-CM | POA: Diagnosis not present

## 2017-05-24 DIAGNOSIS — F339 Major depressive disorder, recurrent, unspecified: Secondary | ICD-10-CM | POA: Diagnosis not present

## 2017-05-24 DIAGNOSIS — C259 Malignant neoplasm of pancreas, unspecified: Secondary | ICD-10-CM | POA: Diagnosis not present

## 2017-05-24 DIAGNOSIS — C772 Secondary and unspecified malignant neoplasm of intra-abdominal lymph nodes: Secondary | ICD-10-CM | POA: Diagnosis not present

## 2017-05-24 DIAGNOSIS — C8590 Non-Hodgkin lymphoma, unspecified, unspecified site: Secondary | ICD-10-CM | POA: Diagnosis not present

## 2017-05-25 DIAGNOSIS — C259 Malignant neoplasm of pancreas, unspecified: Secondary | ICD-10-CM | POA: Diagnosis not present

## 2017-05-25 DIAGNOSIS — C772 Secondary and unspecified malignant neoplasm of intra-abdominal lymph nodes: Secondary | ICD-10-CM | POA: Diagnosis not present

## 2017-05-25 DIAGNOSIS — F339 Major depressive disorder, recurrent, unspecified: Secondary | ICD-10-CM | POA: Diagnosis not present

## 2017-05-25 DIAGNOSIS — C7951 Secondary malignant neoplasm of bone: Secondary | ICD-10-CM | POA: Diagnosis not present

## 2017-05-25 DIAGNOSIS — C771 Secondary and unspecified malignant neoplasm of intrathoracic lymph nodes: Secondary | ICD-10-CM | POA: Diagnosis not present

## 2017-05-25 DIAGNOSIS — C8590 Non-Hodgkin lymphoma, unspecified, unspecified site: Secondary | ICD-10-CM | POA: Diagnosis not present

## 2017-05-26 DIAGNOSIS — C259 Malignant neoplasm of pancreas, unspecified: Secondary | ICD-10-CM | POA: Diagnosis not present

## 2017-05-26 DIAGNOSIS — C7951 Secondary malignant neoplasm of bone: Secondary | ICD-10-CM | POA: Diagnosis not present

## 2017-05-26 DIAGNOSIS — C772 Secondary and unspecified malignant neoplasm of intra-abdominal lymph nodes: Secondary | ICD-10-CM | POA: Diagnosis not present

## 2017-05-26 DIAGNOSIS — F339 Major depressive disorder, recurrent, unspecified: Secondary | ICD-10-CM | POA: Diagnosis not present

## 2017-05-26 DIAGNOSIS — C8590 Non-Hodgkin lymphoma, unspecified, unspecified site: Secondary | ICD-10-CM | POA: Diagnosis not present

## 2017-05-26 DIAGNOSIS — C771 Secondary and unspecified malignant neoplasm of intrathoracic lymph nodes: Secondary | ICD-10-CM | POA: Diagnosis not present

## 2017-05-27 DIAGNOSIS — C8590 Non-Hodgkin lymphoma, unspecified, unspecified site: Secondary | ICD-10-CM | POA: Diagnosis not present

## 2017-05-27 DIAGNOSIS — C771 Secondary and unspecified malignant neoplasm of intrathoracic lymph nodes: Secondary | ICD-10-CM | POA: Diagnosis not present

## 2017-05-27 DIAGNOSIS — F339 Major depressive disorder, recurrent, unspecified: Secondary | ICD-10-CM | POA: Diagnosis not present

## 2017-05-27 DIAGNOSIS — C7951 Secondary malignant neoplasm of bone: Secondary | ICD-10-CM | POA: Diagnosis not present

## 2017-05-27 DIAGNOSIS — C259 Malignant neoplasm of pancreas, unspecified: Secondary | ICD-10-CM | POA: Diagnosis not present

## 2017-05-27 DIAGNOSIS — C772 Secondary and unspecified malignant neoplasm of intra-abdominal lymph nodes: Secondary | ICD-10-CM | POA: Diagnosis not present

## 2017-05-28 DIAGNOSIS — C8590 Non-Hodgkin lymphoma, unspecified, unspecified site: Secondary | ICD-10-CM | POA: Diagnosis not present

## 2017-05-28 DIAGNOSIS — C771 Secondary and unspecified malignant neoplasm of intrathoracic lymph nodes: Secondary | ICD-10-CM | POA: Diagnosis not present

## 2017-05-28 DIAGNOSIS — C259 Malignant neoplasm of pancreas, unspecified: Secondary | ICD-10-CM | POA: Diagnosis not present

## 2017-05-28 DIAGNOSIS — C772 Secondary and unspecified malignant neoplasm of intra-abdominal lymph nodes: Secondary | ICD-10-CM | POA: Diagnosis not present

## 2017-05-28 DIAGNOSIS — C7951 Secondary malignant neoplasm of bone: Secondary | ICD-10-CM | POA: Diagnosis not present

## 2017-05-28 DIAGNOSIS — F339 Major depressive disorder, recurrent, unspecified: Secondary | ICD-10-CM | POA: Diagnosis not present

## 2017-05-29 DIAGNOSIS — C771 Secondary and unspecified malignant neoplasm of intrathoracic lymph nodes: Secondary | ICD-10-CM | POA: Diagnosis not present

## 2017-05-29 DIAGNOSIS — C7951 Secondary malignant neoplasm of bone: Secondary | ICD-10-CM | POA: Diagnosis not present

## 2017-05-29 DIAGNOSIS — F339 Major depressive disorder, recurrent, unspecified: Secondary | ICD-10-CM | POA: Diagnosis not present

## 2017-05-29 DIAGNOSIS — C8590 Non-Hodgkin lymphoma, unspecified, unspecified site: Secondary | ICD-10-CM | POA: Diagnosis not present

## 2017-05-29 DIAGNOSIS — C772 Secondary and unspecified malignant neoplasm of intra-abdominal lymph nodes: Secondary | ICD-10-CM | POA: Diagnosis not present

## 2017-05-29 DIAGNOSIS — C259 Malignant neoplasm of pancreas, unspecified: Secondary | ICD-10-CM | POA: Diagnosis not present

## 2017-05-30 DIAGNOSIS — C8590 Non-Hodgkin lymphoma, unspecified, unspecified site: Secondary | ICD-10-CM | POA: Diagnosis not present

## 2017-05-30 DIAGNOSIS — C771 Secondary and unspecified malignant neoplasm of intrathoracic lymph nodes: Secondary | ICD-10-CM | POA: Diagnosis not present

## 2017-05-30 DIAGNOSIS — C772 Secondary and unspecified malignant neoplasm of intra-abdominal lymph nodes: Secondary | ICD-10-CM | POA: Diagnosis not present

## 2017-05-30 DIAGNOSIS — F339 Major depressive disorder, recurrent, unspecified: Secondary | ICD-10-CM | POA: Diagnosis not present

## 2017-05-30 DIAGNOSIS — C7951 Secondary malignant neoplasm of bone: Secondary | ICD-10-CM | POA: Diagnosis not present

## 2017-05-30 DIAGNOSIS — C259 Malignant neoplasm of pancreas, unspecified: Secondary | ICD-10-CM | POA: Diagnosis not present

## 2017-05-31 DIAGNOSIS — C772 Secondary and unspecified malignant neoplasm of intra-abdominal lymph nodes: Secondary | ICD-10-CM | POA: Diagnosis not present

## 2017-05-31 DIAGNOSIS — F339 Major depressive disorder, recurrent, unspecified: Secondary | ICD-10-CM | POA: Diagnosis not present

## 2017-05-31 DIAGNOSIS — C259 Malignant neoplasm of pancreas, unspecified: Secondary | ICD-10-CM | POA: Diagnosis not present

## 2017-05-31 DIAGNOSIS — C8590 Non-Hodgkin lymphoma, unspecified, unspecified site: Secondary | ICD-10-CM | POA: Diagnosis not present

## 2017-05-31 DIAGNOSIS — C771 Secondary and unspecified malignant neoplasm of intrathoracic lymph nodes: Secondary | ICD-10-CM | POA: Diagnosis not present

## 2017-05-31 DIAGNOSIS — C7951 Secondary malignant neoplasm of bone: Secondary | ICD-10-CM | POA: Diagnosis not present

## 2017-06-01 DIAGNOSIS — C771 Secondary and unspecified malignant neoplasm of intrathoracic lymph nodes: Secondary | ICD-10-CM | POA: Diagnosis not present

## 2017-06-01 DIAGNOSIS — C772 Secondary and unspecified malignant neoplasm of intra-abdominal lymph nodes: Secondary | ICD-10-CM | POA: Diagnosis not present

## 2017-06-01 DIAGNOSIS — C8590 Non-Hodgkin lymphoma, unspecified, unspecified site: Secondary | ICD-10-CM | POA: Diagnosis not present

## 2017-06-01 DIAGNOSIS — C7951 Secondary malignant neoplasm of bone: Secondary | ICD-10-CM | POA: Diagnosis not present

## 2017-06-01 DIAGNOSIS — F339 Major depressive disorder, recurrent, unspecified: Secondary | ICD-10-CM | POA: Diagnosis not present

## 2017-06-01 DIAGNOSIS — C259 Malignant neoplasm of pancreas, unspecified: Secondary | ICD-10-CM | POA: Diagnosis not present

## 2017-06-11 DEATH — deceased

## 2017-07-15 ENCOUNTER — Ambulatory Visit: Payer: Medicare Other | Admitting: Certified Nurse Midwife

## 2019-08-01 ENCOUNTER — Encounter: Payer: Self-pay | Admitting: Certified Nurse Midwife
